# Patient Record
Sex: Male | Born: 1990 | Race: Black or African American | Hispanic: No | Marital: Single | Smoking: Never smoker
Health system: Southern US, Community
[De-identification: ages and names within clinical notes are randomized; demographics above are authoritative.]

## PROBLEM LIST (undated history)

## (undated) DIAGNOSIS — K759 Inflammatory liver disease, unspecified: Secondary | ICD-10-CM

## (undated) DIAGNOSIS — U071 COVID-19: Secondary | ICD-10-CM

## (undated) HISTORY — DX: Inflammatory liver disease, unspecified: K75.9

## (undated) HISTORY — PX: APPENDECTOMY: SHX54

---

## 2014-10-16 ENCOUNTER — Emergency Department (HOSPITAL_COMMUNITY)
Admission: EM | Admit: 2014-10-16 | Discharge: 2014-10-17 | Disposition: A | Discharge status: Home / Self Care | Payer: Self-pay | Attending: Emergency Medicine | Admitting: Emergency Medicine

## 2014-10-16 ENCOUNTER — Encounter (HOSPITAL_COMMUNITY): Payer: Self-pay | Admitting: Emergency Medicine

## 2014-10-16 DIAGNOSIS — R7401 Elevation of levels of liver transaminase levels: Secondary | ICD-10-CM

## 2014-10-16 DIAGNOSIS — R740 Nonspecific elevation of levels of transaminase and lactic acid dehydrogenase [LDH]: Secondary | ICD-10-CM

## 2014-10-16 DIAGNOSIS — Z9049 Acquired absence of other specified parts of digestive tract: Secondary | ICD-10-CM | POA: Insufficient documentation

## 2014-10-16 DIAGNOSIS — B179 Acute viral hepatitis, unspecified: Secondary | ICD-10-CM | POA: Insufficient documentation

## 2014-10-16 NOTE — ED Notes (Signed)
Pt reports just being released from hospital a week ago for stomach flu. Pt states my "sperm is yellow." Pt also reports itching and abdominal pain x1 week.

## 2014-10-17 ENCOUNTER — Emergency Department (HOSPITAL_COMMUNITY): Payer: Self-pay

## 2014-10-17 ENCOUNTER — Telehealth: Payer: Self-pay | Admitting: Gastroenterology

## 2014-10-17 LAB — URINALYSIS, ROUTINE W REFLEX MICROSCOPIC
GLUCOSE, UA: NEGATIVE mg/dL
Hgb urine dipstick: NEGATIVE
Ketones, ur: NEGATIVE mg/dL
Nitrite: NEGATIVE
Protein, ur: NEGATIVE mg/dL
SPECIFIC GRAVITY, URINE: 1.026 (ref 1.005–1.030)
Urobilinogen, UA: 0.2 mg/dL (ref 0.0–1.0)
pH: 5 (ref 5.0–8.0)

## 2014-10-17 LAB — CBC WITH DIFFERENTIAL/PLATELET
BASOS ABS: 0 10*3/uL (ref 0.0–0.1)
BASOS PCT: 1 % (ref 0–1)
Eosinophils Absolute: 0 10*3/uL (ref 0.0–0.7)
Eosinophils Relative: 1 % (ref 0–5)
HCT: 42.4 % (ref 39.0–52.0)
Hemoglobin: 13.9 g/dL (ref 13.0–17.0)
Lymphocytes Relative: 47 % — ABNORMAL HIGH (ref 12–46)
Lymphs Abs: 1.9 10*3/uL (ref 0.7–4.0)
MCH: 27.9 pg (ref 26.0–34.0)
MCHC: 32.8 g/dL (ref 30.0–36.0)
MCV: 85.1 fL (ref 78.0–100.0)
MONO ABS: 0.7 10*3/uL (ref 0.1–1.0)
Monocytes Relative: 17 % — ABNORMAL HIGH (ref 3–12)
NEUTROS ABS: 1.4 10*3/uL — AB (ref 1.7–7.7)
NEUTROS PCT: 34 % — AB (ref 43–77)
Platelets: 213 10*3/uL (ref 150–400)
RBC: 4.98 MIL/uL (ref 4.22–5.81)
RDW: 15.2 % (ref 11.5–15.5)
WBC: 4 10*3/uL (ref 4.0–10.5)

## 2014-10-17 LAB — COMPREHENSIVE METABOLIC PANEL
ALT: 1699 U/L — AB (ref 0–53)
ANION GAP: 4 — AB (ref 5–15)
AST: 1376 U/L — AB (ref 0–37)
Albumin: 3.6 g/dL (ref 3.5–5.2)
Alkaline Phosphatase: 122 U/L — ABNORMAL HIGH (ref 39–117)
BUN: 16 mg/dL (ref 6–23)
CO2: 25 mmol/L (ref 19–32)
Calcium: 8.8 mg/dL (ref 8.4–10.5)
Chloride: 106 mmol/L (ref 96–112)
Creatinine, Ser: 1.18 mg/dL (ref 0.50–1.35)
GFR calc Af Amer: >90 mL/min (ref >90)
GFR calc non Af Amer: 86 mL/min — ABNORMAL LOW (ref >90)
Glucose, Bld: 86 mg/dL (ref 70–99)
Potassium: 3.7 mmol/L (ref 3.5–5.1)
SODIUM: 135 mmol/L (ref 135–145)
TOTAL PROTEIN: 7.1 g/dL (ref 6.0–8.3)
Total Bilirubin: 8.1 mg/dL — ABNORMAL HIGH (ref 0.3–1.2)

## 2014-10-17 LAB — ACETAMINOPHEN LEVEL: Acetaminophen (Tylenol), Serum: <10 ug/mL — ABNORMAL LOW (ref 10–30)

## 2014-10-17 LAB — URINE MICROSCOPIC-ADD ON

## 2014-10-17 MED ORDER — ONDANSETRON HCL 4 MG PO TABS: 4.0000 mg | ORAL_TABLET | Freq: Four times a day (QID) | ORAL | Status: DC

## 2014-10-17 NOTE — Telephone Encounter (Signed)
Patient has been scheduled with Willette ClusterPaula Guenther for 10/19/14

## 2014-10-17 NOTE — ED Provider Notes (Signed)
CSN: [id number]     Arrival date & time 10/16/14  2327 History   First MD Initiated Contact with Patient 10/17/14 [phone]     Chief Complaint  Patient presents with  . mulitple complaints      (Consider location/radiation/quality/duration/timing/severity/associated sxs/prior Treatment) Patient is a 24 y.o. male presenting with abdominal pain. The history is provided by the patient. No language interpreter was used.  Abdominal Pain Associated symptoms: no chills, no fever, no nausea and no vomiting   Associated symptoms comment:  The patient presents with multiple complaints including periumbilical abdominal pain/cramping persistent over the last week, generalized pruritis, a yellow tint to his eyes and he noticed the same yellow tint to his sperm yesterday. He has not has any vomiting, fever, dysuria or dark urine. He reports recent hospitalization for a "stomach flu" discharged 2 weeks ago.   History reviewed. No pertinent past medical history. Past Surgical History  Procedure Laterality Date  . Appendectomy     History reviewed. No pertinent family history. History  Substance Use Topics  . Smoking status: Never Smoker   . Smokeless tobacco: Not on file  . Alcohol Use: Yes    Review of Systems  Constitutional: Negative for fever and chills.  HENT: Negative.   Eyes:       Complains of yellow tint to his eyes. No visual impairment.  Respiratory: Negative.   Cardiovascular: Negative.   Gastrointestinal: Positive for abdominal pain. Negative for nausea and vomiting.  Genitourinary: Negative.   Musculoskeletal: Negative.  Negative for myalgias.  Skin: Negative.  Negative for color change and pallor.  Neurological: Negative.       Allergies  Zyrtec allergy  Home Medications   Prior to Admission medications   Medication Sig Start Date End Date Taking? Authorizing Provider  HYDROcodone-acetaminophen (NORCO/VICODIN) 5-325 MG per tablet Take 1 tablet by mouth every 6 (six)  hours as needed for moderate pain (for pain.).   Yes Historical Provider, MD   BP 134/74 mmHg  Pulse 66  Temp(Src) 98.3 F (36.8 C) (Oral)  Resp 16  SpO2 100% Physical Exam  Constitutional: He is oriented to person, place, and time. He appears well-developed and well-nourished.  HENT:  Head: Normocephalic.  Eyes: Scleral icterus is present.  Neck: Normal range of motion. Neck supple.  Cardiovascular: Normal rate and regular rhythm.   Pulmonary/Chest: Effort normal and breath sounds normal.  Abdominal: Soft. Bowel sounds are normal. There is tenderness. There is no rebound and no guarding.  Very mild tenderness limited to periumbilical abdomen. No distention. No HSM or tenderness over the RUQ.   Musculoskeletal: Normal range of motion. He exhibits no edema.  Neurological: He is alert and oriented to person, place, and time.  Skin: Skin is warm and dry. No rash noted.  Psychiatric: He has a normal mood and affect.    ED Course  Procedures (including critical care time) Labs Review Labs Reviewed  COMPREHENSIVE METABOLIC PANEL  CBC WITH DIFFERENTIAL/PLATELET  URINALYSIS, ROUTINE W REFLEX MICROSCOPIC   Results for orders placed or performed during the hospital encounter of 10/16/14  Comprehensive metabolic panel  Result Value Ref Range   Sodium 135 135 - 145 mmol/L   Potassium 3.7 3.5 - 5.1 mmol/L   Chloride 106 96 - 112 mmol/L   CO2 25 19 - 32 mmol/L   Glucose, Bld 86 70 - 99 mg/dL   BUN 16 6 - 23 mg/dL   Creatinine, Ser 1.191.18 0.50 - 1.35 mg/dL   Calcium 8.8  8.4 - 10.5 mg/dL   Total Protein 7.1 6.0 - 8.3 g/dL   Albumin 3.6 3.5 - 5.2 g/dL   AST 1610 (H) 0 - 37 U/L   ALT 1699 (H) 0 - 53 U/L   Alkaline Phosphatase 122 (H) 39 - 117 U/L   Total Bilirubin 8.1 (H) 0.3 - 1.2 mg/dL   GFR calc non Af Amer 86 (L) >90 mL/min   GFR calc Af Amer >90 >90 mL/min   Anion gap 4 (L) 5 - 15  CBC with Differential  Result Value Ref Range   WBC 4.0 4.0 - 10.5 K/uL   RBC 4.98 4.22 - 5.81  MIL/uL   Hemoglobin 13.9 13.0 - 17.0 g/dL   HCT 96.0 45.4 - 09.8 %   MCV 85.1 78.0 - 100.0 fL   MCH 27.9 26.0 - 34.0 pg   MCHC 32.8 30.0 - 36.0 g/dL   RDW 11.9 14.7 - 82.9 %   Platelets 213 150 - 400 K/uL   Neutrophils Relative % 34 (L) 43 - 77 %   Neutro Abs 1.4 (L) 1.7 - 7.7 K/uL   Lymphocytes Relative 47 (H) 12 - 46 %   Lymphs Abs 1.9 0.7 - 4.0 K/uL   Monocytes Relative 17 (H) 3 - 12 %   Monocytes Absolute 0.7 0.1 - 1.0 K/uL   Eosinophils Relative 1 0 - 5 %   Eosinophils Absolute 0.0 0.0 - 0.7 K/uL   Basophils Relative 1 0 - 1 %   Basophils Absolute 0.0 0.0 - 0.1 K/uL  Urinalysis, Routine w reflex microscopic  Result Value Ref Range   Color, Urine AMBER (A) YELLOW   APPearance HAZY (A) CLEAR   Specific Gravity, Urine 1.026 1.005 - 1.030   pH 5.0 5.0 - 8.0   Glucose, UA NEGATIVE NEGATIVE mg/dL   Hgb urine dipstick NEGATIVE NEGATIVE   Bilirubin Urine MODERATE (A) NEGATIVE   Ketones, ur NEGATIVE NEGATIVE mg/dL   Protein, ur NEGATIVE NEGATIVE mg/dL   Urobilinogen, UA 0.2 0.0 - 1.0 mg/dL   Nitrite NEGATIVE NEGATIVE   Leukocytes, UA SMALL (A) NEGATIVE  Urine microscopic-add on  Result Value Ref Range   WBC, UA 0-2 <3 WBC/hpf   Crystals CA OXALATE CRYSTALS (A) NEGATIVE    Imaging Review No results found.   EKG Interpretation None      MDM   Final diagnoses:  None    1. Acute hepatitis  The patient is well appearing without vomiting or continued diarrhea. No fever. Discussed with Dr. Christella Hartigan of Gastroenterology. Requested additional lab tests but advised patient can be discharged home and managed in the outpatient setting. Return precautions provided to the patient. Zofran given and, as recommended by Dr. Christella Hartigan, encouraged regular use.     Arnoldo Hooker, PA-C 10/17/14 [id number]  Ward Givens, MD 10/17/14 [phone]

## 2014-10-17 NOTE — ED Provider Notes (Signed)
Patient reports he was admitted to the hospital a week ago he was having vomiting. He relates currently he is not having any nausea or vomiting. He denies any abdominal pain except in his lower abdomen. He states he has noticed his urine has been brown in color for the past 3-4 days and his stools are getting lighter in color. Yesterday some friends at school noticed his eyes looked yellow. Patient denies being on any medications including acetaminophen. He denies any IV drug use. He states he's never had this before.  Patient is alert and cooperative. He is noted to have scleral icterus. His abdomen is soft and nontender.  Medical screening examination/treatment/procedure(s) were conducted as a shared visit with non-physician practitioner(s) and myself.  I personally evaluated the patient during the encounter.   EKG Interpretation None        Ward GivensIva L Kiva Norland, MD 10/17/14 [phone]

## 2014-10-17 NOTE — Telephone Encounter (Signed)
Was called by ER last night about him.  Acute hepatitis without clinical signs of encephalopathy.  Agreed it was OK to send him home with expedited out patient follow up.  Viral studies, ANA drawn.  Patty, He needs new gi evaluation with first available MD or extender for acute hepatitis.  Thanks

## 2014-10-17 NOTE — Discharge Instructions (Signed)
IT IS IMPORTANT TO MAINTAIN A NORMAL DIET AND EAT REGULARLY EVEN IF YOU DON'T FEEL LIKE EATING. TAKE ZOFRAN EVERY 8 HOURS TO CONTROL NAUSEA. CALL DR. Christella HartiganJACOBS' OFFICE TO SCHEDULE AN APPOINTMENT FOR TODAY OR TOMORROW.  Jaundice Jaundice is a yellowish discoloration of the skin, whites of the eyes, and mucous membranes. It is caused by increased levels of bilirubin in the blood (hyperbilirubinemia). Bilirubin is produced by the normal breakdown of red blood cells. Jaundice may mean the liver or bile system is not working normally. CAUSES  The most common causes include:  Viral hepatitis.  Gallstones.  Excess use of alcohol.  Liver disease.  Certain cancers. SYMPTOMS   Yellow color to the skin, whites of the eyes, or mucous membranes.  Dark brown colored urine.  Stomach pain.  Light or clay colored stool.  Itchy skin. DIAGNOSIS   Your history will be taken along with a physical exam.  Urine and blood tests.  Abdominal ultrasound.  CT scans.  MRI.  Liver biopsy if the liver disease is suspected.  Endoscopic retrograde cholangiopancreatography (ERCP). TREATMENT  Treatment depends on the cause or related to the treatment of an underlying condition. For example, if jaundice is caused by gallstones, the stones or gallbladder may need to be removed. Other treatments may include:  Rest.  Stopping a certain medicine if it is causing the jaundice.  Giving fluid through the vein (IV fluids).  Surgery (removing gallstones, cancers). Some conditions that cause jaundice can be fatal if not treated. HOME CARE INSTRUCTIONS   Rest.  Drink enough fluids to keep your urine clear or pale yellow.  Avoid all alcoholic drinks.  Only take over-the-counter or prescription medicines for nausea, vomiting, itching, pain, discomfort, or fever as directed by your caregiver.  If jaundice is due to viral hepatitis or an infection:  Avoid close contact with people.  Avoid preparing  food for others.  Avoid sharing utensils with others.  Wash your hands often.  Keep all follow-up appointments with your caregiver.  Use skin lotions to relieve itching. SEEK IMMEDIATE MEDICAL CARE IF:   You have increased pain.  You have repeated vomiting.  You become dehydrated.  You have a fever or persistent symptoms for more than 72 hours.  You have a fever and your symptoms suddenly get worse.  You become weak or confused.  You develop a severe headache. MAKE SURE YOU:   Understand these instructions.  Will watch your condition.  Will get help right away if you are not doing well or get worse. Document Released: 08/10/2005 Document Revised: 11/02/2011 Document Reviewed: 07/25/2010 [facility] Patient Information 2015 [employer] [place]. This information is not intended to replace advice given to you by your health care provider. Make sure you discuss any questions you have with your health care provider.

## 2014-10-18 LAB — FANA STAINING PATTERNS

## 2014-10-18 LAB — ANTINUCLEAR ANTIBODIES, IFA

## 2014-10-18 LAB — HIV ANTIBODY (ROUTINE TESTING W REFLEX): HIV Screen 4th Generation wRfx: NONREACTIVE

## 2014-10-19 ENCOUNTER — Ambulatory Visit (INDEPENDENT_AMBULATORY_CARE_PROVIDER_SITE_OTHER): Payer: Self-pay | Admitting: Nurse Practitioner

## 2014-10-19 ENCOUNTER — Encounter: Payer: Self-pay | Admitting: Nurse Practitioner

## 2014-10-19 ENCOUNTER — Other Ambulatory Visit (INDEPENDENT_AMBULATORY_CARE_PROVIDER_SITE_OTHER): Payer: Self-pay

## 2014-10-19 VITALS — BP 112/80 | HR 60 | Ht 72.0 in | Wt 200.0 lb

## 2014-10-19 DIAGNOSIS — K759 Inflammatory liver disease, unspecified: Secondary | ICD-10-CM

## 2014-10-19 DIAGNOSIS — R945 Abnormal results of liver function studies: Secondary | ICD-10-CM

## 2014-10-19 DIAGNOSIS — R7989 Other specified abnormal findings of blood chemistry: Secondary | ICD-10-CM

## 2014-10-19 LAB — HEPATIC FUNCTION PANEL
ALT: 2233 U/L — ABNORMAL HIGH (ref 0–53)
AST: 2088 U/L — ABNORMAL HIGH (ref 0–37)
Albumin: 3.9 g/dL (ref 3.5–5.2)
Alkaline Phosphatase: 144 U/L — ABNORMAL HIGH (ref 39–117)
Bilirubin, Direct: 6.4 mg/dL — ABNORMAL HIGH (ref 0.0–0.3)
Total Bilirubin: 10 mg/dL — ABNORMAL HIGH (ref 0.2–1.2)
Total Protein: 7.5 g/dL (ref 6.0–8.3)

## 2014-10-19 LAB — PROTIME-INR
INR: 1.1 ratio — AB (ref 0.8–1.0)
Prothrombin Time: 12 s (ref 9.6–13.1)

## 2014-10-19 NOTE — Patient Instructions (Addendum)
Your physician has requested that you go to the basement for lab work before leaving today. I  You have been scheduled for an  Ultrasound with doppler of hepatic and portal vein on 10-26-14 at 9:30am. Please arrive 15 minutes prior to your appointment for registration. Make certain not to have anything to eat or drink after midnight. Should you need to reschedule your appointment, please contact [facility] [phone].  [address] [place]  Please keep follow up appt for 10-23-14 at 3:00pm

## 2014-10-20 ENCOUNTER — Telehealth: Payer: Self-pay | Admitting: Internal Medicine

## 2014-10-20 LAB — IGG: IGG (IMMUNOGLOBIN G), SERUM: 2390 mg/dL — AB (ref 650–1600)

## 2014-10-20 LAB — HEPATITIS C ANTIBODY: HCV AB: NEGATIVE

## 2014-10-20 LAB — HEPATITIS B SURF AG CONFIRMATION: Hepatitis B Surf Ag Confirmation: POSITIVE — AB

## 2014-10-20 LAB — HEPATITIS B SURFACE ANTIGEN: Hepatitis B Surface Ag: POSITIVE — AB

## 2014-10-20 LAB — HEPATITIS A ANTIBODY, TOTAL: Hep A Total Ab: NONREACTIVE

## 2014-10-20 MED ORDER — PREDNISONE 10 MG PO TABS: 40.0000 mg | ORAL_TABLET | Freq: Every day | ORAL | Status: DC

## 2014-10-20 NOTE — Telephone Encounter (Signed)
I have performed chart review after seeing pt yesterday with Willette ClusterPaula Guenther, NP Hepatitis with cholestasis and jaundice ANA is elevated as is IGG, suggestive of AIH I called [facility] labs and the ASMA is "only run on Tuesday and Friday".  I asked it to be run stat on Monday Hep B surface Ag should be resulted later today INR is stable at 1.1 Pt is at home with mother and father I spoke to all 3 this afternoon.  No bleeding or confusion.  Still be nausea, itching and fatigue I am going to start prednisone for AIH empirically while awaiting on other labs.  I advised they stay with him and watch for any confusion or change in mental status.  Should this occur they are instructed to go to ED immediately.  They voiced understanding Need to check TPMT Tuesday at office follow-up Prednisone 40 mg daily  If no response or worsening may need liver bx or hepatology referral to [facility], [facility] or [facility] in [place]

## 2014-10-21 ENCOUNTER — Telehealth: Payer: Self-pay | Admitting: Internal Medicine

## 2014-10-21 NOTE — Telephone Encounter (Signed)
New information Hep B Surf Ag is Positive which is consistent with Hep B most likely acute.  We need Core IgM and viral load (viral load to be drawn, Core IgM is pending from acute hep panel from 10/17/2014) Unsure why ANA and IGG are so positive, could still be AIH, but the 2 together would be rare I spoke to him by phone, if anything he feels a bit better today Will have him stop prednisone for now, pending more information OV Tuesday as scheduled Followup pending labs If LFTs worsen still or INR elevates then will start Hep B treatment with antivirals (though virus should clear spontaneous leading to immunity) If picture remains unclear then will pursue liver biopsy

## 2014-10-22 ENCOUNTER — Telehealth: Payer: Self-pay | Admitting: Nurse Practitioner

## 2014-10-22 ENCOUNTER — Encounter: Payer: Self-pay | Admitting: Nurse Practitioner

## 2014-10-22 ENCOUNTER — Telehealth: Payer: Self-pay | Admitting: *Deleted

## 2014-10-22 DIAGNOSIS — R945 Abnormal results of liver function studies: Secondary | ICD-10-CM | POA: Insufficient documentation

## 2014-10-22 DIAGNOSIS — R7989 Other specified abnormal findings of blood chemistry: Secondary | ICD-10-CM | POA: Insufficient documentation

## 2014-10-22 LAB — ANA: ANA: NEGATIVE

## 2014-10-22 LAB — ANTI-SMOOTH MUSCLE ANTIBODY, IGG: Smooth Muscle Ab: 27 U — ABNORMAL HIGH (ref <20)

## 2014-10-22 LAB — ALPHA-1-ANTITRYPSIN: A-1 Antitrypsin, Ser: 227 mg/dL — ABNORMAL HIGH (ref 83–199)

## 2014-10-22 LAB — CERULOPLASMIN: Ceruloplasmin: 37 mg/dL — ABNORMAL HIGH (ref 18–36)

## 2014-10-22 NOTE — Telephone Encounter (Signed)
Per Willette ClusterPaula Guenther NP-C, called  Imaging at [phone] and cancelled the US Abd Pelvis Vein doppler.

## 2014-10-22 NOTE — Progress Notes (Addendum)
HPI :  Patient is a 24 year old male referred by [facility] emergency department for evaluation of abnormal liver function studies. Patient was [place] [facility] [place] [facility] for fever, nausea, vomiting, and abdominal pain. Patient already had a stomach virus but also some liver problems. He stayed hospitalized for 1 day then was discharged home. He recently discharge patient developed bloody diarrhea which lasted 3-4 days. Symptoms resolved he felt okay until a couple of days ago when abdominal pain, nausea and vomiting recurred. Patient's mother who is a nurse noticed that he was quite jaundiced. Urine was dark. Mother advised him to go to the emergency department. In the ED on 2/24 his AST was nearly 1400, ALT 1700. Total bili 8.1, alkaline phosphatase 122. CBC was unremarkable. Right upper quadrant ultrasound was normal. EGD spoke with our physician on call, Dr. Christella HartiganJacobs, who recommended an acute hepatitis panel and outpatient follow-up.   Patient is here today for EGD follow-up. He actually feels fine. No nausea, vomiting, diarrhea or abdominal pain. Unfortunately I have no labs from his [place] hospitalization. Patient has no prior history of any gastrointestinal problems. He has no known history of liver disease. No family history of liver disease. No history of blood transfusions, IV drug use. He is homosexual, practices protected sex.  Patient is in culinary stool.    Past Medical History  Diagnosis Date  . Hepatitis     Family History  Problem Relation Age of Onset  . Diabetes Maternal Grandfather   . Liver cancer Paternal Grandfather   . Diabetes Paternal Grandfather    History  Substance Use Topics  . Smoking status: Never Smoker   . Smokeless tobacco: Never Used  . Alcohol Use: 0.0 oz/week    0 Standard drinks or equivalent per week   Current Outpatient Prescriptions  Medication Sig Dispense Refill  . HYDROcodone-acetaminophen (NORCO/VICODIN) 5-325 MG  per tablet Take 1 tablet by mouth every 6 (six) hours as needed for moderate pain (for pain.).    Marland Kitchen. ondansetron (ZOFRAN) 4 MG tablet Take 1 tablet (4 mg total) by mouth every 6 (six) hours. 12 tablet 0  . predniSONE (DELTASONE) 10 MG tablet Take 4 tablets (40 mg total) by mouth daily with breakfast. 100 tablet 0   No current facility-administered medications for this visit.   Allergies  Allergen Reactions  . Zyrtec Allergy [Cetirizine Hcl] Swelling     Review of Systems: All systems reviewed and negative except where noted in HPI.    [place] Abdomen Limited Ruq  10/17/2014   CLINICAL DATA:  Transaminitis, hyperbilirubinemia.  EXAM: US ABDOMEN LIMITED - RIGHT UPPER QUADRANT  COMPARISON:  None.  FINDINGS: Gallbladder:  No gallstones or wall thickening visualized. No sonographic Murphy sign noted.  Common bile duct:  Diameter: 3 mm  Liver:  No focal lesion identified. Within normal limits in parenchymal echogenicity. Hepatopetal portal vein.  IMPRESSION: Normal RIGHT upper quadrant ultrasound.   Electronically Signed   By: Awilda Metroourtnay  Bloomer   On: 10/17/2014 05:10    Physical Exam: BP 112/80 mmHg  Pulse 60  Ht 6' (1.829 m)  Wt 200 lb (90.719 kg)  BMI 27.12 kg/m2 Constitutional: Pleasant,well-developed, black male in no acute distress. HEENT: Normocephalic and atraumatic. Conjunctivae are normal. No scleral icterus. Neck supple.  Cardiovascular: Normal rate, regular rhythm.  Pulmonary/chest: Effort normal and breath sounds normal. No wheezing, rales or rhonchi. Abdominal: Soft, nondistended, nontender. Bowel sounds active throughout. There are no masses palpable. No hepatomegaly.  Extremities: no edema Lymphadenopathy: No cervical adenopathy noted. Neurological: Alert and oriented to person place and time. Skin: Skin is warm and dry. No rashes noted. Psychiatric: Normal mood and affect. Behavior is normal.   ASSESSMENT AND PLAN:   24 year old healthy black male with hepatitis of unclear  etiology. He does have risk factors for viral hepatitis but need to evaluate for autoimmune / genetic liver diseases. Patient looks / feels okay. Unfortunately, his acute hepatitis panel drawn in the emergency department is not yet available. Will wait for those, check autoimmune/genetic markers, and INR. Patient will need very close follow-up, I will see him again with labs on Tuesday (today is Friday). He is at risk for acute liver failure. Patient was advised to stay home with family over the weekend in case he became acutely ill / developed encephalopathic changes. Mother is a Engineer, civil (consulting). Given this patient's age and remarkably abnormal LFTs I discussed this case with Dr. Rhea Belton who will be patient's gastroenterologist. Dr. Rhea Belton will be check on labs over the weekend  Addendum: Reviewed and agree with initial management. See telephone notes for detail (from 10/20/2014 and 10/21/2014) Discussed with Dr. Arlyce Dice, Wells Guiles at morning meeting Additional studies still pending.  This is likely acute Hep B, but I cannot exclude AIH and this may require Bx.  Will refer to [facility] for additional opinion.  Beverley Fiedler, MD

## 2014-10-23 ENCOUNTER — Telehealth: Payer: Self-pay | Admitting: Nurse Practitioner

## 2014-10-23 ENCOUNTER — Ambulatory Visit: Payer: Self-pay | Admitting: Nurse Practitioner

## 2014-10-23 NOTE — Telephone Encounter (Signed)
Patient's father notified that he does need to keep the appt for today if [facility].  Patient was instructed last night by Willette ClusterPaula Guenther RNP to [facility] [place] this am to the ER for possible admission.  [place] is still [place] [facility] [place] [facility] [place] this afternoon.  They are waiting on a child to get out of school to [facility].  Father did not understand why he would have an appointment today here and supposed to be in [place] also.  I explained that the appt today with Willette ClusterPaula Guenther RNP was a follow up that was scheduled at the last office visit.  I explained that the only reason to keep the appt today here with Willette ClusterPaula Guenther RNP , if [facility] were not [facility] and had more questions that need answering.  Earlier the father called and wanted him to be referred to a [place]atologist [place] [facility] [place].  While I was documenting this note the patient's mother called.  I spoke with her and explained the seriousness of his condition.  I explained that we tried to [facility] here and they did not feel it was appropriate to be seen [place].  He may require admission and additional testing and needs to [facility]. Gunnar Fusiaula has  Mother verbalized understanding of the importance of him getting [facility] as instructed last night by Willette ClusterPaula Guenther RNP.  See phone note from 10/22/14

## 2014-10-23 NOTE — Telephone Encounter (Signed)
Sean West,  See message from the patient.  They want to speak with you again.

## 2014-10-23 NOTE — Telephone Encounter (Signed)
See additional phone note from 10-23-2014.

## 2014-10-23 NOTE — Telephone Encounter (Signed)
Yesterday I spoke with Sean Majorawn Drazek, NP with [facility]. Discussed patient's case / labs. Dawn contacted Hepatologist Dr. Hamilton CapriZamor in [place] who recommended [facility] for evaluation and possible admission. I spoke with patient and his father. They are making arrangements for child care but will [facility].

## 2014-10-24 LAB — HEPATITIS PANEL, ACUTE
HCV AB: NEGATIVE
Hep A IgM: NONREACTIVE
Hep B C IgM: REACTIVE — AB
Hepatitis B Surface Ag: POSITIVE — AB

## 2014-10-24 LAB — HEPATITIS B SURF AG CONFIRMATION: Hepatitis B Surf Ag Confirmation: POSITIVE — AB

## 2014-10-26 ENCOUNTER — Other Ambulatory Visit: Payer: Self-pay

## 2014-10-30 ENCOUNTER — Ambulatory Visit: Payer: Self-pay | Admitting: Nurse Practitioner

## 2014-10-31 ENCOUNTER — Ambulatory Visit (INDEPENDENT_AMBULATORY_CARE_PROVIDER_SITE_OTHER): Payer: Self-pay

## 2014-10-31 DIAGNOSIS — Z23 Encounter for immunization: Secondary | ICD-10-CM

## 2014-10-31 DIAGNOSIS — Z113 Encounter for screening for infections with a predominantly sexual mode of transmission: Secondary | ICD-10-CM

## 2014-10-31 DIAGNOSIS — B20 Human immunodeficiency virus [HIV] disease: Secondary | ICD-10-CM

## 2014-11-01 LAB — URINALYSIS
BILIRUBIN URINE: NEGATIVE
GLUCOSE, UA: NEGATIVE mg/dL
HGB URINE DIPSTICK: NEGATIVE
Ketones, ur: NEGATIVE mg/dL
NITRITE: NEGATIVE
PH: 5 (ref 5.0–8.0)
Protein, ur: 30 mg/dL — AB
Specific Gravity, Urine: 1.021 (ref 1.005–1.030)
Urobilinogen, UA: 1 mg/dL (ref 0.0–1.0)

## 2014-11-01 LAB — RPR

## 2014-11-01 LAB — T-HELPER CELL (CD4) - (RCID CLINIC ONLY)
CD4 T CELL HELPER: 18 % — AB (ref 33–55)
CD4 T Cell Abs: 180 /uL — ABNORMAL LOW (ref 400–2700)

## 2014-11-01 LAB — URINE CYTOLOGY ANCILLARY ONLY
CHLAMYDIA, DNA PROBE: NEGATIVE
NEISSERIA GONORRHEA: NEGATIVE

## 2014-11-02 LAB — QUANTIFERON TB GOLD ASSAY (BLOOD)
Interferon Gamma Release Assay: NEGATIVE
MITOGEN VALUE: 1.94 [IU]/mL
QUANTIFERON NIL VALUE: 0.09 [IU]/mL
Quantiferon Tb Ag Minus Nil Value: 0 IU/mL
TB Ag value: 0.09 IU/mL

## 2014-11-02 LAB — HIV-1 RNA ULTRAQUANT REFLEX TO GENTYP+: HIV-1 RNA Quant, Log: >7 {Log} — ABNORMAL HIGH (ref <1.30)

## 2014-11-05 LAB — HLA B*5701: HLA-B*5701 w/rflx HLA-B High: NEGATIVE

## 2014-11-08 LAB — HIV-1 GENOTYPR PLUS

## 2014-11-08 NOTE — Progress Notes (Signed)
Patient here today for new hiv intake, he was diagnosed last month. He is in  very good spirits today, and has a lot of family support. He is accompanied by his Father today. Patient was [facility] for acute hepatitis B infection and worked received and extensive workup where he was initially diagnosed and referred to us. Patient was educated on the importance of condom use, vaccines given and records received. Patient is scheduled to return in 2 weeks.

## 2014-11-09 ENCOUNTER — Encounter: Payer: Self-pay | Admitting: *Deleted

## 2014-11-09 ENCOUNTER — Encounter: Payer: Self-pay | Admitting: Internal Medicine

## 2014-11-09 ENCOUNTER — Telehealth: Payer: Self-pay | Admitting: *Deleted

## 2014-11-09 ENCOUNTER — Ambulatory Visit (INDEPENDENT_AMBULATORY_CARE_PROVIDER_SITE_OTHER): Payer: Self-pay | Admitting: Internal Medicine

## 2014-11-09 VITALS — BP 132/79 | HR 79 | Temp 98.1°F | Ht 72.0 in | Wt 193.0 lb

## 2014-11-09 DIAGNOSIS — B161 Acute hepatitis B with delta-agent without hepatic coma: Secondary | ICD-10-CM

## 2014-11-09 DIAGNOSIS — R740 Nonspecific elevation of levels of transaminase and lactic acid dehydrogenase [LDH]: Secondary | ICD-10-CM

## 2014-11-09 DIAGNOSIS — B20 Human immunodeficiency virus [HIV] disease: Secondary | ICD-10-CM

## 2014-11-09 DIAGNOSIS — L989 Disorder of the skin and subcutaneous tissue, unspecified: Secondary | ICD-10-CM

## 2014-11-09 DIAGNOSIS — B169 Acute hepatitis B without delta-agent and without hepatic coma: Secondary | ICD-10-CM

## 2014-11-09 DIAGNOSIS — R7401 Elevation of levels of liver transaminase levels: Secondary | ICD-10-CM

## 2014-11-09 DIAGNOSIS — Z8619 Personal history of other infectious and parasitic diseases: Secondary | ICD-10-CM

## 2014-11-09 DIAGNOSIS — R110 Nausea: Secondary | ICD-10-CM

## 2014-11-09 LAB — COMPLETE METABOLIC PANEL WITH GFR
ALT: 334 U/L — AB (ref 0–53)
AST: 148 U/L — AB (ref 0–37)
Albumin: 3.5 g/dL (ref 3.5–5.2)
Alkaline Phosphatase: 108 U/L (ref 39–117)
BUN: 11 mg/dL (ref 6–23)
CALCIUM: 9.2 mg/dL (ref 8.4–10.5)
CHLORIDE: 103 meq/L (ref 96–112)
CO2: 29 mEq/L (ref 19–32)
CREATININE: 1.02 mg/dL (ref 0.50–1.35)
GFR, Est African American: >89 mL/min
GFR, Est Non African American: >89 mL/min
Glucose, Bld: 85 mg/dL (ref 70–99)
POTASSIUM: 4.3 meq/L (ref 3.5–5.3)
SODIUM: 139 meq/L (ref 135–145)
Total Bilirubin: 1.6 mg/dL — ABNORMAL HIGH (ref 0.2–1.2)
Total Protein: 7.2 g/dL (ref 6.0–8.3)

## 2014-11-09 MED ORDER — ELVITEG-COBIC-EMTRICIT-TENOFDF 150-150-200-300 MG PO TABS: 1.0000 | ORAL_TABLET | Freq: Every day | ORAL | Status: DC

## 2014-11-09 MED ORDER — PROMETHAZINE HCL 25 MG PO TABS: 25.0000 mg | ORAL_TABLET | Freq: Four times a day (QID) | ORAL | Status: DC | PRN

## 2014-11-09 NOTE — Progress Notes (Addendum)
Patient ID: Sean West, male   DOB: [birth year], 24 y.o.   MRN: [id number]       Patient ID: Sean West, male   DOB: 01-12-1991, 24 y.o.   MRN: [id number]  HPI Sean West is a 24yo M with acute HIV disease,( CD 4 count of 180/VL 35M +, genotype [id number], R NVP ). His RF for HIV is MSM. Has hx of having symptomatic gonorrhea last year. He has had several ED visits/hospitalizations since end of January- February 2016.  On jan 23,2016, he had short hospitalization for gastroenteritis, presenting with n/v/abd pain. Labs reveal cr 1.66, ast 63,and alt 103, tbili 1.8, lipase 10, ruq u/s normal. But did not do any hepatitis or hiv serology at that time.  On 2/24, he was seen in at [facility] ED, for jaundice, light colored stool. At this time he has extremely elevated transaminitis. AST of 1376 and ALT 1699. Tbili 10, conjugated bili 6.4, INR 1.1. Acute hep panel showed hep B surface antigen POSITIVE, hep B core IgM POSITIVE. RUQ nl. HIV ab negative. He did not have any mental status changes. Asked to no longer take any tylenol products, no alcohol and follow up in the GI clinic on 2/26  On 2/26-2/29, he seen at [facility] GI who ran labs to Auto immune hepatitis, wilson's disease, but felt that this was consistent with acute hepatitis B but also possible AI hepatitis. LFT still trending up at AST 2088 and ALT 2233.   He was referred to follow up with [facility] GI on 3/02 for further management. Hep B viral laod was 631,632 and HIV RNA viral laod was 6,213,086. Repeat RUQ normal but had slightly enlarged liver. AST 498 and ALT 954, which was trending down already over 2 days. AT this time, he was only given supportive care. Repeat testing showed c/w acute hep B. They repeated HIV test, which was now positive. He did subscribe to flu like illness during his visit at [facility] and not originally back on 2/24 where he was suffering from hepatitis.  He reports that he has Had only 1 sexual partner, in Feb. Had 2  other partners in January, and in December had 1 partner but the same person as February. Of the 3 people he has been with since December, he has reached 2 of them, who have been tested, and reportedly negative  No IVDU  [employer] [facility]   Allergies  Allergen Reactions  . Zyrtec Allergy [Cetirizine Hcl] Swelling    Outpatient Encounter Prescriptions as of 11/09/2014  Medication Sig  . [DISCONTINUED] HYDROcodone-acetaminophen (NORCO/VICODIN) 5-325 MG per tablet Take 1 tablet by mouth every 6 (six) hours as needed for moderate pain (for pain.).  . [DISCONTINUED] ondansetron (ZOFRAN) 4 MG tablet Take 1 tablet (4 mg total) by mouth every 6 (six) hours. (Patient not taking: Reported on 11/08/2014)  . [DISCONTINUED] predniSONE (DELTASONE) 10 MG tablet Take 4 tablets (40 mg total) by mouth daily with breakfast. (Patient not taking: Reported on 11/08/2014)     Patient Active Problem List   Diagnosis Date Noted  . Abnormal liver function tests 10/22/2014     Health Maintenance Due  Topic Date Due  . TETANUS/TDAP  09/09/2011     Review of Systems Ac rash - eczema. 12 point ros is otherwise negative Physical Exam   BP 132/79 mmHg  Pulse 79  Temp(Src) 98.1 F (36.7 C) (Oral)  Ht 6' (1.829 m)  Wt 193 lb (87.544 kg)  BMI 26.17 kg/m2 Physical  Exam  Constitutional: He is oriented to person, place, and time. He appears well-developed and well-nourished. No distress.  HENT:  Mouth/Throat: Oropharynx is clear and moist. No oropharyngeal exudate.  Cardiovascular: Normal rate, regular rhythm and normal heart sounds. Exam reveals no gallop and no friction rub.  No murmur heard.  Pulmonary/Chest: Effort normal and breath sounds normal. No respiratory distress. He has no wheezes.  Abdominal: Soft. Bowel sounds are normal. He exhibits no distension. There is no tenderness.  Lymphadenopathy:  He has no cervical adenopathy.  Neurological: He is alert and oriented to person, place,  and time.  Skin: Skin is warm and dry. Mild raised papular plaque rash erythematous appearance both AC Psychiatric: He has a normal mood and affect. His behavior is normal.     Lab Results  Component Value Date   CD4TCELL 18* 10/31/2014   Lab Results  Component Value Date   CD4TABS 180* 10/31/2014   Lab Results  Component Value Date   HIV1RNAQUANT >10000000* 10/31/2014   No results found for: HEPBSAB No results found for: RPR  CBC Lab Results  Component Value Date   WBC 4.1 11/09/2014   HGB 13.7 11/09/2014   HCT 40.1 11/09/2014   MCV 82.0 11/09/2014   PLT 331 11/09/2014   CMP     Component Value Date/Time   NA 139 11/09/2014 1154   K 4.3 11/09/2014 1154   CL 103 11/09/2014 1154   CO2 29 11/09/2014 1154   GLUCOSE 85 11/09/2014 1154   BUN 11 11/09/2014 1154   CREATININE 1.02 11/09/2014 1154   CREATININE 1.18 10/17/2014 0207   CALCIUM 9.2 11/09/2014 1154   PROT 7.2 11/09/2014 1154   ALBUMIN 3.5 11/09/2014 1154   AST 148* 11/09/2014 1154   ALT 334* 11/09/2014 1154   ALKPHOS 108 11/09/2014 1154   BILITOT 1.6* 11/09/2014 1154   GFRNONAA >89 11/09/2014 1154   GFRNONAA 86* 10/17/2014 0207   GFRAA >89 11/09/2014 1154   GFRAA >90 10/17/2014 0207     Assessment and Plan  Acute hiv disease = we will start him on stribild. Gave instructions to how to take meds. If he is feeling nausea, can take phenergan to help with symptoms. We will apply for emergency ADAP  Acute hepatitis b = transaminitis appears to be improving. Tbili now only 1.6 and transaminitis are improved as well now down to 148 and 334. We will plan on getting CMP again in 10-14 days to insure he is improving. We will check for hep b viral load.   We will start hep A vaccination at next visit  hiv prevention = recommend to abstain from sex over the next 4-6 wk and use condoms for all sexual acts. He has exceedingly high viral load in acute setting  Vaccinations = will start his vaccines at next visit,  hep A, flu, pneumonia  oi proph = cd 4 count is temporarily low, anticipate to be > 200 in 1 month. No need for bactrim  Eczema = continue with hydrocortisone cream prn  Get outside records from [facility] and [facility] to confirm data  rtc for labs in 4 wk and appt in 6 wk

## 2014-11-09 NOTE — Telephone Encounter (Signed)
[facility] Per Dr. Drue SecondSnider, this application should be marked Urgent and rushed due to the patient's health. Andree CossHowell, Michelle M, RN

## 2014-11-10 LAB — CBC WITH DIFFERENTIAL/PLATELET
BASOS ABS: 0.1 10*3/uL (ref 0.0–0.1)
BASOS PCT: 2 % — AB (ref 0–1)
EOS PCT: 0 % (ref 0–5)
Eosinophils Absolute: 0 10*3/uL (ref 0.0–0.7)
HCT: 40.1 % (ref 39.0–52.0)
Hemoglobin: 13.7 g/dL (ref 13.0–17.0)
Lymphocytes Relative: 53 % — ABNORMAL HIGH (ref 12–46)
Lymphs Abs: 2.2 10*3/uL (ref 0.7–4.0)
MCH: 28 pg (ref 26.0–34.0)
MCHC: 34.2 g/dL (ref 30.0–36.0)
MCV: 82 fL (ref 78.0–100.0)
MONO ABS: 0.9 10*3/uL (ref 0.1–1.0)
MPV: 10.4 fL (ref 8.6–12.4)
Monocytes Relative: 21 % — ABNORMAL HIGH (ref 3–12)
Neutro Abs: 1 10*3/uL — ABNORMAL LOW (ref 1.7–7.7)
Neutrophils Relative %: 24 % — ABNORMAL LOW (ref 43–77)
Platelets: 331 10*3/uL (ref 150–400)
RBC: 4.89 MIL/uL (ref 4.22–5.81)
RDW: 14.7 % (ref 11.5–15.5)
WBC: 4.1 10*3/uL (ref 4.0–10.5)

## 2014-11-12 ENCOUNTER — Encounter: Payer: Self-pay | Admitting: Internal Medicine

## 2014-11-12 DIAGNOSIS — Z8619 Personal history of other infectious and parasitic diseases: Secondary | ICD-10-CM | POA: Insufficient documentation

## 2014-11-12 DIAGNOSIS — B169 Acute hepatitis B without delta-agent and without hepatic coma: Secondary | ICD-10-CM | POA: Insufficient documentation

## 2014-11-12 DIAGNOSIS — B20 Human immunodeficiency virus [HIV] disease: Secondary | ICD-10-CM | POA: Insufficient documentation

## 2014-11-12 LAB — PATHOLOGIST SMEAR REVIEW

## 2014-11-12 MED ORDER — ELVITEG-COBIC-EMTRICIT-TENOFDF 150-150-200-300 MG PO TABS: 1.0000 | ORAL_TABLET | Freq: Every day | ORAL | Status: DC

## 2014-11-14 LAB — HEPATITIS B DNA, ULTRAQUANTITATIVE, PCR
Hepatitis B DNA (Calc): 2064290 copies/mL — ABNORMAL HIGH (ref <116)
Hepatitis B DNA: 354689 IU/mL — ABNORMAL HIGH (ref <20)

## 2014-11-15 ENCOUNTER — Ambulatory Visit: Payer: Self-pay | Admitting: Infectious Disease

## 2014-11-21 NOTE — Telephone Encounter (Signed)
This is a duplicate message.  See other phone notes from 10/23/14

## 2014-12-11 ENCOUNTER — Encounter: Payer: Self-pay | Admitting: Internal Medicine

## 2014-12-11 ENCOUNTER — Other Ambulatory Visit: Payer: Self-pay

## 2014-12-17 ENCOUNTER — Other Ambulatory Visit: Payer: Self-pay | Admitting: *Deleted

## 2014-12-17 DIAGNOSIS — B20 Human immunodeficiency virus [HIV] disease: Secondary | ICD-10-CM

## 2014-12-17 MED ORDER — ELVITEG-COBIC-EMTRICIT-TENOFDF 150-150-200-300 MG PO TABS: 1.0000 | ORAL_TABLET | Freq: Every day | ORAL | Status: DC

## 2014-12-18 ENCOUNTER — Other Ambulatory Visit (INDEPENDENT_AMBULATORY_CARE_PROVIDER_SITE_OTHER): Payer: Self-pay

## 2014-12-18 DIAGNOSIS — B20 Human immunodeficiency virus [HIV] disease: Secondary | ICD-10-CM

## 2014-12-18 LAB — CBC WITH DIFFERENTIAL/PLATELET
BASOS ABS: 0 10*3/uL (ref 0.0–0.1)
Basophils Relative: 1 % (ref 0–1)
EOS ABS: 0.1 10*3/uL (ref 0.0–0.7)
EOS PCT: 3 % (ref 0–5)
HCT: 43 % (ref 39.0–52.0)
Hemoglobin: 15 g/dL (ref 13.0–17.0)
LYMPHS ABS: 2.2 10*3/uL (ref 0.7–4.0)
Lymphocytes Relative: 52 % — ABNORMAL HIGH (ref 12–46)
MCH: 28.8 pg (ref 26.0–34.0)
MCHC: 34.9 g/dL (ref 30.0–36.0)
MCV: 82.7 fL (ref 78.0–100.0)
MONOS PCT: 10 % (ref 3–12)
MPV: 10.3 fL (ref 8.6–12.4)
Monocytes Absolute: 0.4 10*3/uL (ref 0.1–1.0)
NEUTROS ABS: 1.5 10*3/uL — AB (ref 1.7–7.7)
Neutrophils Relative %: 34 % — ABNORMAL LOW (ref 43–77)
Platelets: 221 10*3/uL (ref 150–400)
RBC: 5.2 MIL/uL (ref 4.22–5.81)
RDW: 14.8 % (ref 11.5–15.5)
WBC: 4.3 10*3/uL (ref 4.0–10.5)

## 2014-12-18 LAB — COMPREHENSIVE METABOLIC PANEL
ALT: 25 U/L (ref 0–53)
AST: 26 U/L (ref 0–37)
Albumin: 4.1 g/dL (ref 3.5–5.2)
Alkaline Phosphatase: 58 U/L (ref 39–117)
BILIRUBIN TOTAL: 1 mg/dL (ref 0.2–1.2)
BUN: 12 mg/dL (ref 6–23)
CO2: 24 mEq/L (ref 19–32)
CREATININE: 1.12 mg/dL (ref 0.50–1.35)
Calcium: 9.6 mg/dL (ref 8.4–10.5)
Chloride: 105 mEq/L (ref 96–112)
Glucose, Bld: 80 mg/dL (ref 70–99)
POTASSIUM: 4.4 meq/L (ref 3.5–5.3)
SODIUM: 140 meq/L (ref 135–145)
Total Protein: 7.3 g/dL (ref 6.0–8.3)

## 2014-12-20 LAB — T-HELPER CELL (CD4) - (RCID CLINIC ONLY)
CD4 % Helper T Cell: 26 % — ABNORMAL LOW (ref 33–55)
CD4 T Cell Abs: 530 /uL (ref 400–2700)

## 2014-12-20 LAB — HIV-1 RNA QUANT-NO REFLEX-BLD
HIV 1 RNA Quant: 73 copies/mL — ABNORMAL HIGH (ref <20)
HIV-1 RNA QUANT, LOG: 1.86 {Log} — AB (ref <1.30)

## 2014-12-25 ENCOUNTER — Ambulatory Visit: Payer: Self-pay | Admitting: Internal Medicine

## 2015-01-07 ENCOUNTER — Ambulatory Visit (INDEPENDENT_AMBULATORY_CARE_PROVIDER_SITE_OTHER): Payer: Self-pay | Admitting: Internal Medicine

## 2015-01-07 ENCOUNTER — Encounter: Payer: Self-pay | Admitting: Internal Medicine

## 2015-01-07 VITALS — Temp 98.8°F | Wt 210.0 lb

## 2015-01-07 DIAGNOSIS — Z23 Encounter for immunization: Secondary | ICD-10-CM

## 2015-01-07 DIAGNOSIS — B169 Acute hepatitis B without delta-agent and without hepatic coma: Secondary | ICD-10-CM

## 2015-01-07 LAB — COMPLETE METABOLIC PANEL WITH GFR
ALT: 31 U/L (ref 0–53)
AST: 25 U/L (ref 0–37)
Albumin: 4.2 g/dL (ref 3.5–5.2)
Alkaline Phosphatase: 54 U/L (ref 39–117)
BILIRUBIN TOTAL: 0.9 mg/dL (ref 0.2–1.2)
BUN: 12 mg/dL (ref 6–23)
CHLORIDE: 103 meq/L (ref 96–112)
CO2: 28 mEq/L (ref 19–32)
CREATININE: 1.39 mg/dL — AB (ref 0.50–1.35)
Calcium: 9.8 mg/dL (ref 8.4–10.5)
GFR, EST NON AFRICAN AMERICAN: 71 mL/min
GFR, Est African American: 83 mL/min
GLUCOSE: 91 mg/dL (ref 70–99)
Potassium: 4.6 mEq/L (ref 3.5–5.3)
Sodium: 140 mEq/L (ref 135–145)
Total Protein: 7.5 g/dL (ref 6.0–8.3)

## 2015-01-07 NOTE — Progress Notes (Signed)
Patient ID: Sean West, male   DOB: March 29, 1991, 24 y.o.   MRN: [id number]       Patient ID: Sean West, male   DOB: March 29, 1991, 24 y.o.   MRN: [id number]  HPI 24 yo M with acute hiv-hep B who recently diagnosed with both infections. He was initially seen in Mid March to start stribild. After 1 month of treatment, CD 4 count of 530/VL 73. He continues to do well with meds. Not missing any doses, although he has lost his bottle that contained last 5 pills. He  States the has not difficulty with his medications feeling great.  Not currently sexually active. Back to work.  Outpatient Encounter Prescriptions as of 01/07/2015  Medication Sig  . elvitegravir-cobicistat-emtricitabine-tenofovir (STRIBILD) 150-150-200-300 MG TABS tablet Take 1 tablet by mouth daily with breakfast.  . promethazine (PHENERGAN) 25 MG tablet Take 1 tablet (25 mg total) by mouth every 6 (six) hours as needed for nausea or vomiting.   No facility-administered encounter medications on file as of 01/07/2015.     Patient Active Problem List   Diagnosis Date Noted  . HIV disease 11/12/2014  . Acute hepatitis B 11/12/2014  . History of gonorrhea 11/12/2014  . Abnormal liver function tests 10/22/2014     Health Maintenance Due  Topic Date Due  . TETANUS/TDAP  09/09/2011     Review of Systems  Constitutional: Negative for fever, chills, diaphoresis, activity change, appetite change, fatigue and unexpected weight change.  HENT: Negative for congestion, sore throat, rhinorrhea, sneezing, trouble swallowing and sinus pressure.  Eyes: Negative for photophobia and visual disturbance.  Respiratory: Negative for cough, chest tightness, shortness of breath, wheezing and stridor.  Cardiovascular: Negative for chest pain, palpitations and leg swelling.  Gastrointestinal: Negative for nausea, vomiting, abdominal pain, diarrhea, constipation, blood in stool, abdominal distention and anal bleeding.    Genitourinary: Negative for dysuria, hematuria, flank pain and difficulty urinating.  Musculoskeletal: Negative for myalgias, back pain, joint swelling, arthralgias and gait problem.  Skin: Negative for color change, pallor, rash and wound.  Neurological: Negative for dizziness, tremors, weakness and light-headedness.  Hematological: Negative for adenopathy. Does not bruise/bleed easily.  Psychiatric/Behavioral: Negative for behavioral problems, confusion, sleep disturbance, dysphoric mood, decreased concentration and agitation.    Physical Exam   Temp(Src) 98.8 F (37.1 C) (Oral)  Wt 210 lb (95.255 kg) Physical Exam  Constitutional: He is oriented to person, place, and time. He appears well-developed and well-nourished. No distress.  HENT:  Mouth/Throat: Oropharynx is clear and moist. No oropharyngeal exudate.  Cardiovascular: Normal rate, regular rhythm and normal heart sounds. Exam reveals no gallop and no friction rub.  No murmur heard.  Pulmonary/Chest: Effort normal and breath sounds normal. No respiratory distress. He has no wheezes.  Abdominal: Soft. Bowel sounds are normal. He exhibits no distension. There is no tenderness.  Lymphadenopathy:  He has no cervical adenopathy.  Neurological: He is alert and oriented to person, place, and time.  Skin: Skin is warm and dry. No rash noted. No erythema.  Psychiatric: He has a normal mood and affect. His behavior is normal.    Lab Results  Component Value Date   CD4TCELL 26* 12/18/2014   Lab Results  Component Value Date   CD4TABS 530 12/18/2014   CD4TABS 180* 10/31/2014   Lab Results  Component Value Date   HIV1RNAQUANT 73* 12/18/2014   No results found for: HEPBSAB No results found for: RPR  CBC Lab Results  Component Value Date   WBC 4.3  12/18/2014   RBC 5.20 12/18/2014   HGB 15.0 12/18/2014   HCT 43.0 12/18/2014   PLT 221 12/18/2014   MCV 82.7 12/18/2014   MCH 28.8 12/18/2014   MCHC 34.9 12/18/2014   RDW  14.8 12/18/2014   LYMPHSABS 2.2 12/18/2014   MONOABS 0.4 12/18/2014   EOSABS 0.1 12/18/2014   BASOSABS 0.0 12/18/2014   BMET Lab Results  Component Value Date   NA 140 12/18/2014   K 4.4 12/18/2014   CL 105 12/18/2014   CO2 24 12/18/2014   GLUCOSE 80 12/18/2014   BUN 12 12/18/2014   CREATININE 1.12 12/18/2014   CALCIUM 9.6 12/18/2014   GFRNONAA >89 11/09/2014   GFRAA >89 11/09/2014     Assessment and Plan  hiv disease = labs at 1 month show great response to therapy. Likely undetectable by now. We will check his labs in 2 months ( 3 month mark)  Hep b = we will check hepatitis B viral load to see response to treatment. Continue to take meds. Will also vaccinate him for hep A. Will check CMP to ensure ALT/AST back to baseline  Access to meds = likely can refill meds

## 2015-01-11 LAB — HEPATITIS B DNA, ULTRAQUANTITATIVE, PCR: Hepatitis B DNA: <20 IU/mL (ref <20)

## 2015-02-20 ENCOUNTER — Telehealth: Payer: Self-pay | Admitting: *Deleted

## 2015-02-20 NOTE — Telephone Encounter (Signed)
Patient called c/o runny nose, diarrhea and fever the past few days. His Mom wanted him to be seen; but he feels he is improving and no longer feels feverish. He still has some diarrhea, but that is improving also. Advised patient to give us a call if he worsens.  Reminded him that we do not see patient's on Friday morning, as there is no provider scheduled.

## 2015-02-21 ENCOUNTER — Telehealth: Payer: Self-pay | Admitting: *Deleted

## 2015-02-21 NOTE — Telephone Encounter (Signed)
Patient called back today, stating he was still feeling the same way.  He decided he would like an appointment.  He reports diarrhea and night sweats x 2 weeks, sinus congestion and productive cough (white phlegm) for 2 weeks.  He did have a fever at the beginning (104 per patient), but this has resolved.   RN gave patient 1st available appointment 7/5 11:00 with Dr. Drue SecondSnider.    Diarrhea occurs daily, ranges from watery - soft, not-formed stool.  He is having no incontinence or difficulty eating. No nausea or vomiting. For his respiratory symptoms he is taking loratadine (he is allergic to zyrtec), OTC cold/cough gel caps, and mucinex DM at night but feels it is not working.  He takes ibuprofen for his headache.  He reports still having a feeling of fullness in his face and ears as well as a headache in the front of his head.  RN advised patient to stop the loratadine, cold/cough gel caps, Mucinex DM.  Advised he could try allegra-D for allergies and sinus congestion, saline spray for his nose, ibuprofen for swelling and headache.  RN advised the patient to follow the directions on the bottles and to stay hydrated.  He is advised to go to urgent care or the ED if his symptoms get worse.  Patient verbalized understanding, agreement. Andree CossHowell, Drew Herman M, RN

## 2015-02-26 ENCOUNTER — Ambulatory Visit: Payer: Self-pay | Admitting: Internal Medicine

## 2015-04-02 ENCOUNTER — Ambulatory Visit: Payer: Self-pay | Admitting: Internal Medicine

## 2015-06-03 ENCOUNTER — Ambulatory Visit: Payer: Self-pay

## 2015-06-03 ENCOUNTER — Other Ambulatory Visit: Payer: Self-pay | Admitting: Infectious Diseases

## 2015-06-03 DIAGNOSIS — B20 Human immunodeficiency virus [HIV] disease: Secondary | ICD-10-CM

## 2015-06-03 MED ORDER — ELVITEG-COBIC-EMTRICIT-TENOFDF 150-150-200-300 MG PO TABS: 1.0000 | ORAL_TABLET | Freq: Every day | ORAL | Status: DC

## 2015-06-11 ENCOUNTER — Ambulatory Visit: Payer: Self-pay | Admitting: Internal Medicine

## 2015-07-02 ENCOUNTER — Other Ambulatory Visit: Payer: Self-pay | Admitting: *Deleted

## 2015-07-02 DIAGNOSIS — B20 Human immunodeficiency virus [HIV] disease: Secondary | ICD-10-CM

## 2015-07-02 MED ORDER — ELVITEG-COBIC-EMTRICIT-TENOFDF 150-150-200-300 MG PO TABS: 1.0000 | ORAL_TABLET | Freq: Every day | ORAL | Status: DC

## 2015-07-08 ENCOUNTER — Encounter: Payer: Self-pay | Admitting: Internal Medicine

## 2015-07-08 ENCOUNTER — Ambulatory Visit (INDEPENDENT_AMBULATORY_CARE_PROVIDER_SITE_OTHER): Payer: Self-pay | Admitting: Internal Medicine

## 2015-07-08 VITALS — BP 146/75 | HR 61 | Temp 97.5°F | Wt 220.0 lb

## 2015-07-08 DIAGNOSIS — B181 Chronic viral hepatitis B without delta-agent: Secondary | ICD-10-CM

## 2015-07-08 DIAGNOSIS — Z113 Encounter for screening for infections with a predominantly sexual mode of transmission: Secondary | ICD-10-CM

## 2015-07-08 DIAGNOSIS — B20 Human immunodeficiency virus [HIV] disease: Secondary | ICD-10-CM

## 2015-07-08 DIAGNOSIS — Z23 Encounter for immunization: Secondary | ICD-10-CM

## 2015-07-08 DIAGNOSIS — R369 Urethral discharge, unspecified: Secondary | ICD-10-CM

## 2015-07-08 DIAGNOSIS — Z7251 High risk heterosexual behavior: Secondary | ICD-10-CM

## 2015-07-08 LAB — CBC WITH DIFFERENTIAL/PLATELET
BASOS ABS: 0.1 10*3/uL (ref 0.0–0.1)
Basophils Relative: 2 % — ABNORMAL HIGH (ref 0–1)
EOS PCT: 1 % (ref 0–5)
Eosinophils Absolute: 0 10*3/uL (ref 0.0–0.7)
HEMATOCRIT: 45.3 % (ref 39.0–52.0)
Hemoglobin: 16.1 g/dL (ref 13.0–17.0)
LYMPHS ABS: 1.2 10*3/uL (ref 0.7–4.0)
LYMPHS PCT: 37 % (ref 12–46)
MCH: 29.1 pg (ref 26.0–34.0)
MCHC: 35.5 g/dL (ref 30.0–36.0)
MCV: 81.8 fL (ref 78.0–100.0)
MPV: 10.1 fL (ref 8.6–12.4)
Monocytes Absolute: 0.5 10*3/uL (ref 0.1–1.0)
Monocytes Relative: 15 % — ABNORMAL HIGH (ref 3–12)
NEUTROS ABS: 1.4 10*3/uL — AB (ref 1.7–7.7)
Neutrophils Relative %: 45 % (ref 43–77)
Platelets: 204 10*3/uL (ref 150–400)
RBC: 5.54 MIL/uL (ref 4.22–5.81)
RDW: 13.5 % (ref 11.5–15.5)
WBC: 3.2 10*3/uL — ABNORMAL LOW (ref 4.0–10.5)

## 2015-07-08 LAB — COMPLETE METABOLIC PANEL WITH GFR
ALBUMIN: 4 g/dL (ref 3.6–5.1)
ALK PHOS: 56 U/L (ref 40–115)
ALT: 15 U/L (ref 9–46)
AST: 21 U/L (ref 10–40)
BUN: 15 mg/dL (ref 7–25)
CALCIUM: 9.8 mg/dL (ref 8.6–10.3)
CHLORIDE: 104 mmol/L (ref 98–110)
CO2: 29 mmol/L (ref 20–31)
Creat: 1.24 mg/dL (ref 0.60–1.35)
GFR, Est Non African American: 82 mL/min (ref >=60)
Glucose, Bld: 81 mg/dL (ref 65–99)
POTASSIUM: 4.2 mmol/L (ref 3.5–5.3)
Sodium: 140 mmol/L (ref 135–146)
Total Bilirubin: 0.7 mg/dL (ref 0.2–1.2)
Total Protein: 7.1 g/dL (ref 6.1–8.1)

## 2015-07-08 NOTE — Progress Notes (Signed)
Patient ID: Sean West, male   DOB: [birth year], 24 y.o.   MRN: [id number]       Patient ID: Sean West, male   DOB: [birth year], 24 y.o.   MRN: [id number]  HPI 24yo M with HIV disease-chronic Hep B, CD 4 count of 530/VL 73 (in April 2016). Was doing well on meds, off of them for 1 month due to running out of health coverage, restarted in the last 2 months without difficulty. He states that he has been in good health, except in the last 2 days he is noticing white discharge from penis. He had unprotecte dsex (brokein condom) with his significant other. No other rash, fever, chills.  Roughly 3 years [facility]  Outpatient Encounter Prescriptions as of 07/08/2015  Medication Sig  . elvitegravir-cobicistat-emtricitabine-tenofovir (STRIBILD) 150-150-200-300 MG TABS tablet Take 1 tablet by mouth daily with breakfast.  . promethazine (PHENERGAN) 25 MG tablet Take 1 tablet (25 mg total) by mouth every 6 (six) hours as needed for nausea or vomiting.   No facility-administered encounter medications on file as of 07/08/2015.     Patient Active Problem List   Diagnosis Date Noted  . HIV disease (HCC) 11/12/2014  . Acute hepatitis B 11/12/2014  . History of gonorrhea 11/12/2014  . Abnormal liver function tests 10/22/2014     Health Maintenance Due  Topic Date Due  . TETANUS/TDAP  09/09/2011  . INFLUENZA VACCINE  03/25/2015     Review of Systems Review of Systems  Constitutional: Negative for fever, chills, diaphoresis, activity change, appetite change, fatigue and unexpected weight change.  HENT: Negative for congestion, sore throat, rhinorrhea, sneezing, trouble swallowing and sinus pressure.  Eyes: Negative for photophobia and visual disturbance.  Respiratory: Negative for cough, chest tightness, shortness of breath, wheezing and stridor.  Cardiovascular: Negative for chest pain, palpitations and leg swelling.  Gastrointestinal: Negative for nausea,  vomiting, abdominal pain, diarrhea, constipation, blood in stool, abdominal distention and anal bleeding.  Genitourinary: penile discharge Musculoskeletal: Negative for myalgias, back pain, joint swelling, arthralgias and gait problem.  Skin: Negative for color change, pallor, rash and wound.  Neurological: Negative for dizziness, tremors, weakness and light-headedness.  Hematological: Negative for adenopathy. Does not bruise/bleed easily.  Psychiatric/Behavioral: Negative for behavioral problems, confusion, sleep disturbance, dysphoric mood, decreased concentration and agitation.    Physical Exam   BP 146/75 mmHg  Pulse 61  Temp(Src) 97.5 F (36.4 C) (Oral)  Wt 220 lb (99.791 kg) Physical Exam  Constitutional: He is oriented to person, place, and time. He appears well-developed and well-nourished. No distress.  HENT:  Mouth/Throat: Oropharynx is clear and moist. No oropharyngeal exudate.  Cardiovascular: Normal rate, regular rhythm and normal heart sounds. Exam reveals no gallop and no friction rub.  No murmur heard.  Pulmonary/Chest: Effort normal and breath sounds normal. No respiratory distress. He has no wheezes.  Abdominal: Soft. Bowel sounds are normal. He exhibits no distension. There is no tenderness.  Lymphadenopathy:  He has no cervical adenopathy.  Neurological: He is alert and oriented to person, place, and time.  Skin: Skin is warm and dry. No rash noted. No erythema.  Psychiatric: He has a normal mood and affect. His behavior is normal.     Lab Results  Component Value Date   CD4TCELL 26* 12/18/2014   Lab Results  Component Value Date   CD4TABS 530 12/18/2014   CD4TABS 180* 10/31/2014   Lab Results  Component Value Date   HIV1RNAQUANT 73* 12/18/2014  No results found for: HEPBSAB No results found for: RPR  CBC Lab Results  Component Value Date   WBC 4.3 12/18/2014   RBC 5.20 12/18/2014   HGB 15.0 12/18/2014   HCT 43.0 12/18/2014   PLT 221  12/18/2014   MCV 82.7 12/18/2014   MCH 28.8 12/18/2014   MCHC 34.9 12/18/2014   RDW 14.8 12/18/2014   LYMPHSABS 2.2 12/18/2014   MONOABS 0.4 12/18/2014   EOSABS 0.1 12/18/2014   BASOSABS 0.0 12/18/2014   BMET Lab Results  Component Value Date   NA 140 01/07/2015   K 4.6 01/07/2015   CL 103 01/07/2015   CO2 28 01/07/2015   GLUCOSE 91 01/07/2015   BUN 12 01/07/2015   CREATININE 1.39* 01/07/2015   CALCIUM 9.8 01/07/2015   GFRNONAA 71 01/07/2015   GFRAA 83 01/07/2015     Assessment and Plan  hiv disease = we will continue on stribild, in addition we will check labs today  Health maintenance = will give hep a #2 and hpv #1, and flu  Penile discharge = will treat for presumed gc/chlam with ctx  IM, 1gm azithromycin,  in addition to testing urethral, anal and pharyngeal specimens in addition to testing for rpr  Unprotected sex = will give condoms  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 For now, likely slightly elevated due to

## 2015-07-09 DIAGNOSIS — Z23 Encounter for immunization: Secondary | ICD-10-CM

## 2015-07-09 LAB — T-HELPER CELL (CD4) - (RCID CLINIC ONLY)
CD4 T CELL ABS: 370 /uL — AB (ref 400–2700)
CD4 T CELL HELPER: 28 % — AB (ref 33–55)

## 2015-07-09 LAB — CYTOLOGY, (ORAL, ANAL, URETHRAL) ANCILLARY ONLY
CHLAMYDIA, DNA PROBE: POSITIVE — AB
Chlamydia: NEGATIVE
Chlamydia: NEGATIVE
NEISSERIA GONORRHEA: POSITIVE — AB
Neisseria Gonorrhea: NEGATIVE
Neisseria Gonorrhea: POSITIVE — AB

## 2015-07-09 LAB — RPR

## 2015-07-09 MED ORDER — AZITHROMYCIN 250 MG PO TABS: 1000.0000 mg | ORAL_TABLET | Freq: Every day | ORAL | Status: DC

## 2015-07-09 MED ORDER — AZITHROMYCIN 250 MG PO TABS
1000.0000 mg | ORAL_TABLET | Freq: Once | ORAL | Status: AC
  Administered 2015-07-08: 1000 mg via ORAL

## 2015-07-09 MED ORDER — CEFTRIAXONE SODIUM 1 G IJ SOLR
250.0000 mg | Freq: Once | INTRAMUSCULAR | Status: AC
  Administered 2015-07-08: 250 mg via INTRAMUSCULAR

## 2015-07-09 NOTE — Addendum Note (Signed)
Addended by: Alesia MorinPOOLE, Philippe Gang F on: 07/09/2015 02:20 PM   Modules accepted: Orders

## 2015-07-10 ENCOUNTER — Telehealth: Payer: Self-pay | Admitting: *Deleted

## 2015-07-10 LAB — HIV-1 RNA QUANT-NO REFLEX-BLD: HIV-1 RNA Quant, Log: <1.3 Log copies/mL (ref <1.30)

## 2015-07-10 NOTE — Telephone Encounter (Signed)
Let him know that his partner needs to get treated for Gonorrhea and chlamydia at [facility], otherwise they will continuously reinfect each other. thx

## 2015-07-10 NOTE — Telephone Encounter (Signed)
Patient called and advised he was positive for GC/Chlymadia but that he was already treated at his visit. Reminded him to use condoms every time and to keep his follow up appt next month.

## 2015-07-22 LAB — HEPATITIS B DNA, ULTRAQUANTITATIVE, PCR: Hepatitis B DNA: <20 IU/mL (ref <20)

## 2015-07-29 LAB — T-HELPER CELL (CD4) - (RCID CLINIC ONLY)

## 2015-08-08 ENCOUNTER — Ambulatory Visit: Payer: Self-pay

## 2015-09-30 ENCOUNTER — Other Ambulatory Visit: Payer: Self-pay

## 2015-10-14 ENCOUNTER — Ambulatory Visit: Payer: Self-pay | Admitting: Internal Medicine

## 2015-10-22 ENCOUNTER — Ambulatory Visit: Payer: Self-pay | Admitting: Internal Medicine

## 2016-01-11 IMAGING — US US ABDOMEN LIMITED
1 series · 14 of 25 positions shown · non-contrast
Comparison: None.

CLINICAL DATA: Transaminitis, hyperbilirubinemia.

EXAM:
US ABDOMEN LIMITED - RIGHT UPPER QUADRANT

[Series 1: us abdomen limited · 0.21mm/px · 14 of 54 slices shown]
[im 1/54]
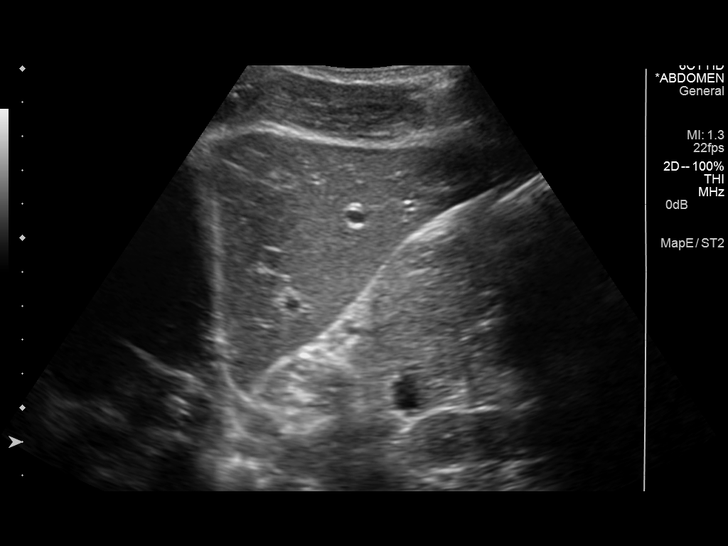
[im 5/54]
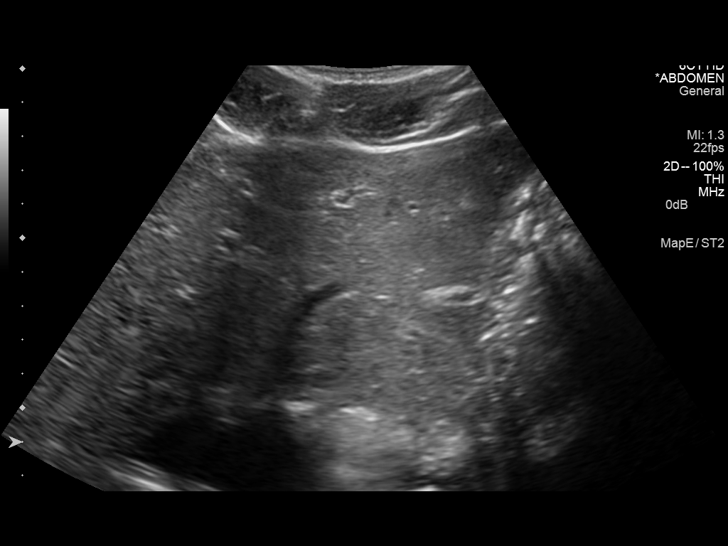
[im 9/54]
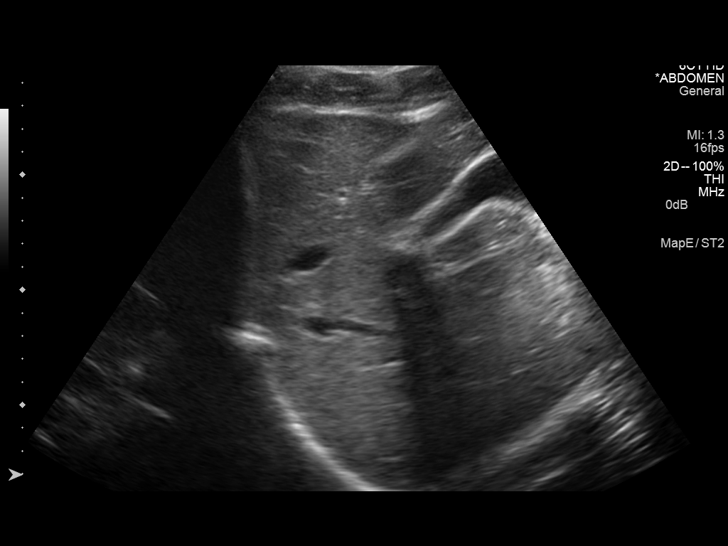
[im 14/54]
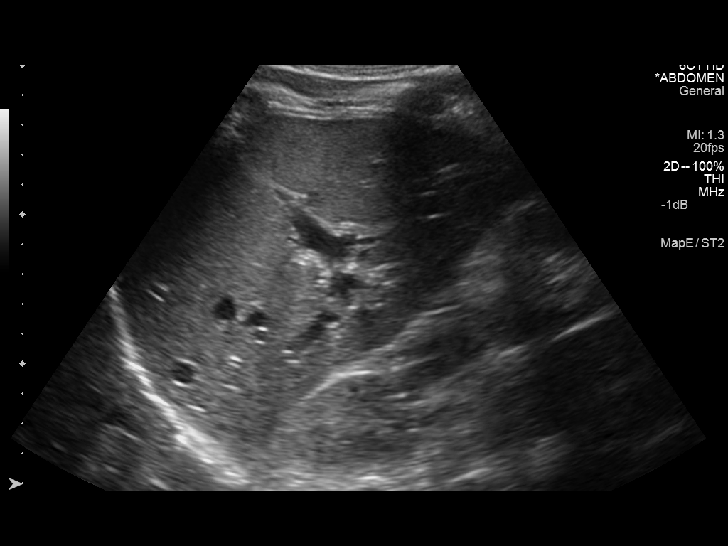
[im 18/54]
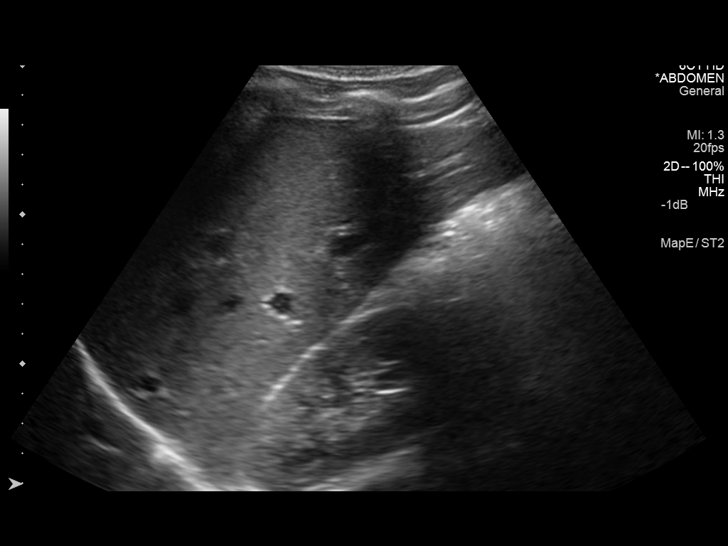
[im 20/54]
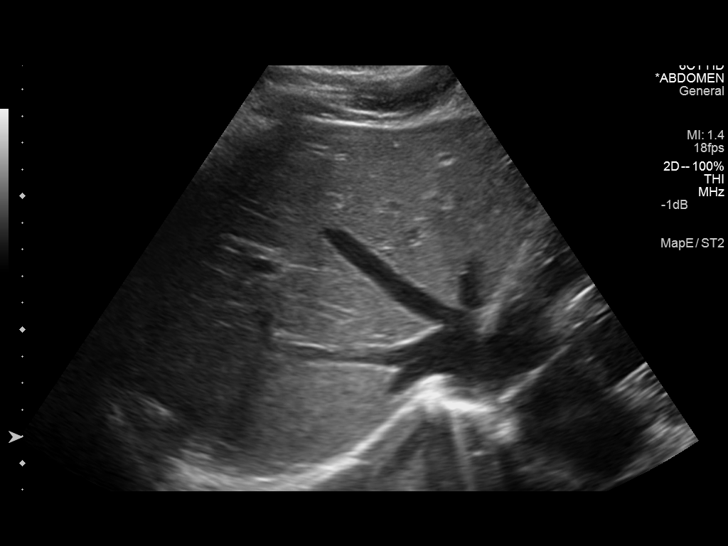
[im 25/54]
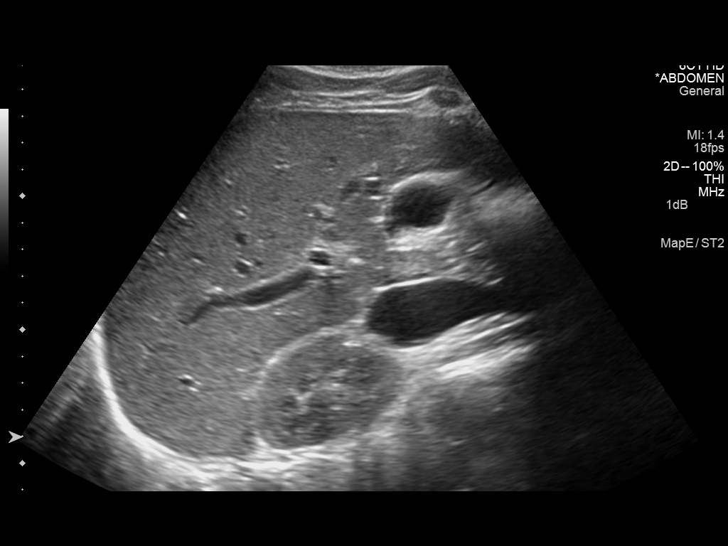
[im 29/54]
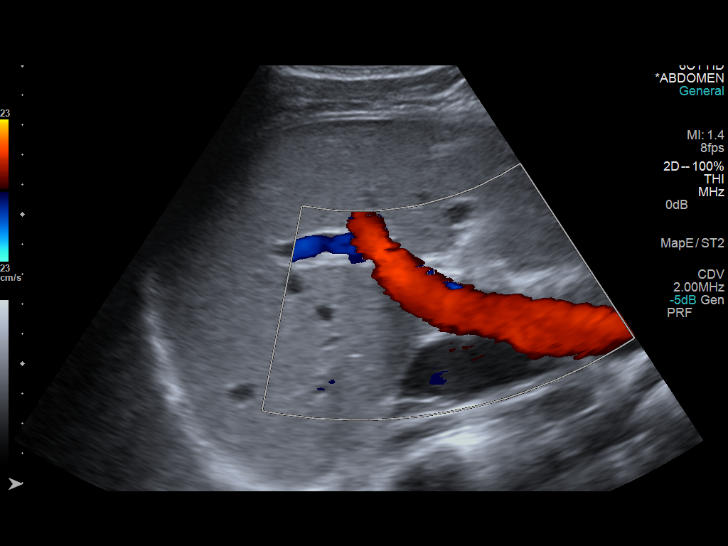
[im 34/54]
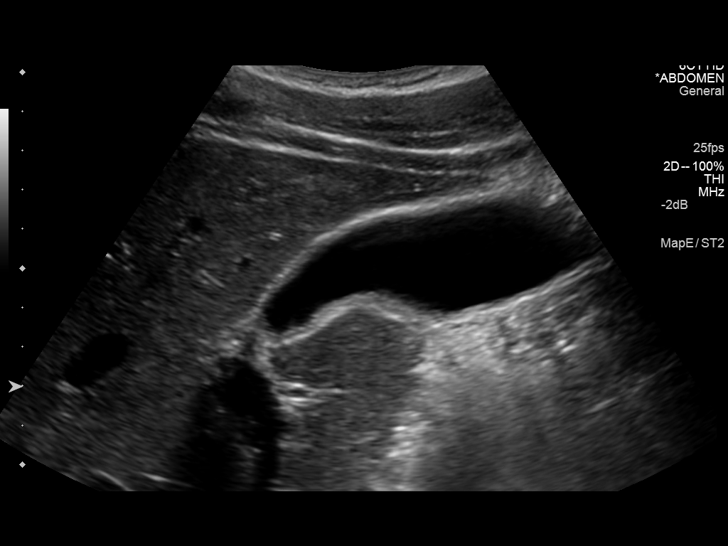
[im 36/54]
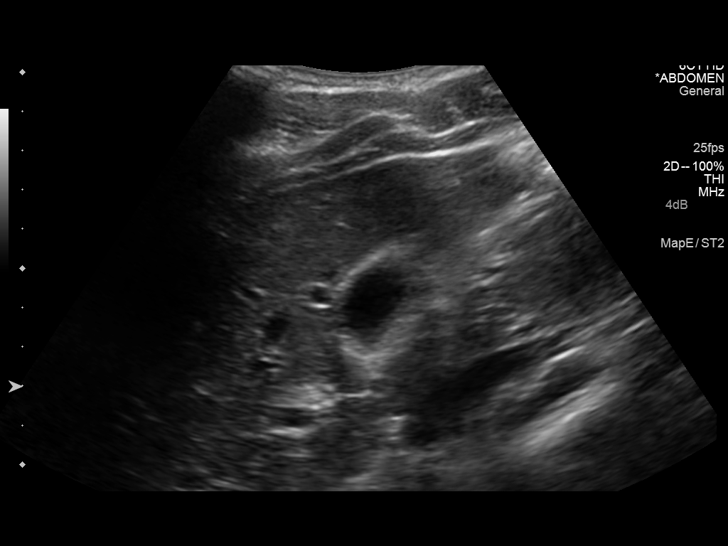
[im 40/54]
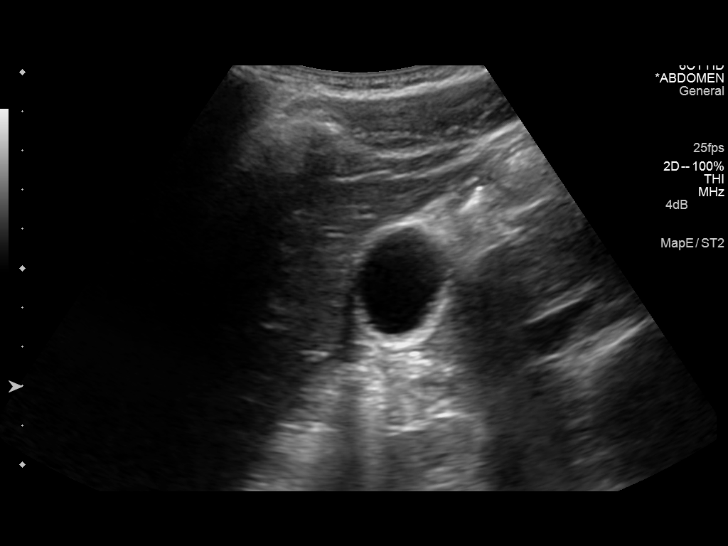
[im 45/54]
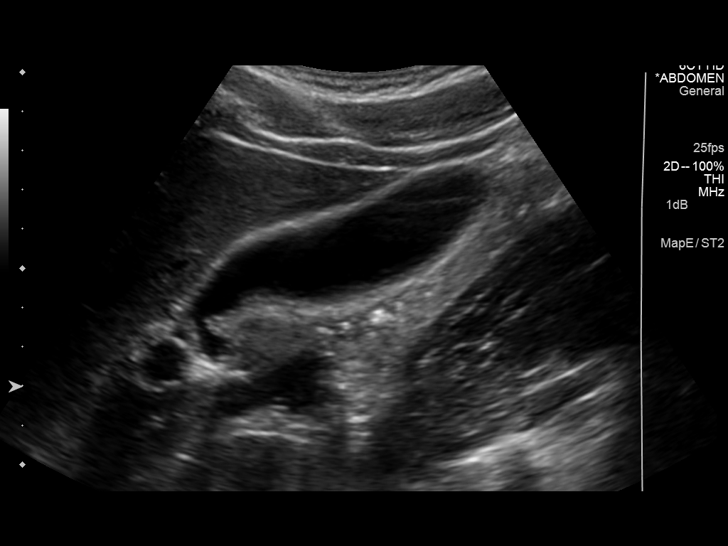
[im 49/54]
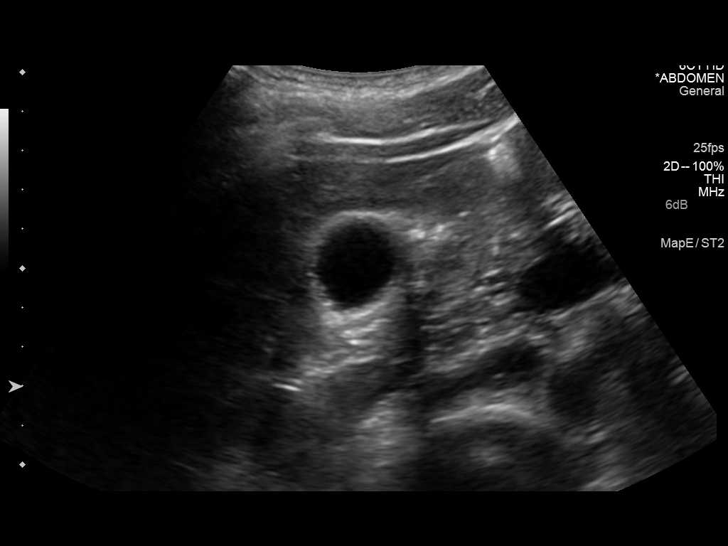
[im 54/54]
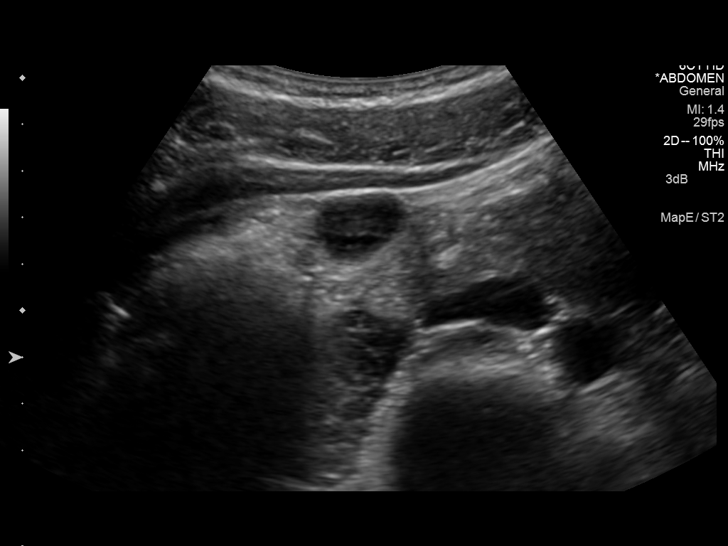

[14 of 25 positions shown; findings below may reference images not displayed]

FINDINGS: Gallbladder:

No gallstones or wall thickening visualized. No sonographic Murphy
sign noted.

Common bile duct:

Diameter: 3 mm

Liver:

No focal lesion identified. Within normal limits in parenchymal
echogenicity. Hepatopetal portal vein.
IMPRESSION: Normal RIGHT upper quadrant ultrasound.

  By: Ansano Razvan

## 2016-01-29 ENCOUNTER — Encounter: Payer: Self-pay | Admitting: Internal Medicine

## 2016-03-10 ENCOUNTER — Other Ambulatory Visit: Payer: Self-pay

## 2016-03-10 ENCOUNTER — Other Ambulatory Visit (INDEPENDENT_AMBULATORY_CARE_PROVIDER_SITE_OTHER): Payer: Self-pay

## 2016-03-10 DIAGNOSIS — B20 Human immunodeficiency virus [HIV] disease: Secondary | ICD-10-CM

## 2016-03-10 LAB — CBC WITH DIFFERENTIAL/PLATELET
BASOS ABS: 32 {cells}/uL (ref 0–200)
Basophils Relative: 1 %
EOS ABS: 32 {cells}/uL (ref 15–500)
EOS PCT: 1 %
HEMATOCRIT: 44.1 % (ref 38.5–50.0)
HEMOGLOBIN: 14.9 g/dL (ref 13.2–17.1)
LYMPHS ABS: 1472 {cells}/uL (ref 850–3900)
Lymphocytes Relative: 46 %
MCH: 27.7 pg (ref 27.0–33.0)
MCHC: 33.8 g/dL (ref 32.0–36.0)
MCV: 82 fL (ref 80.0–100.0)
MONO ABS: 448 {cells}/uL (ref 200–950)
MPV: 9.8 fL (ref 7.5–12.5)
Monocytes Relative: 14 %
NEUTROS ABS: 1216 {cells}/uL — AB (ref 1500–7800)
NEUTROS PCT: 38 %
Platelets: 240 10*3/uL (ref 140–400)
RBC: 5.38 MIL/uL (ref 4.20–5.80)
RDW: 13.2 % (ref 11.0–15.0)
WBC: 3.2 10*3/uL — ABNORMAL LOW (ref 3.8–10.8)

## 2016-03-11 LAB — COMPREHENSIVE METABOLIC PANEL
ALK PHOS: 56 U/L (ref 40–115)
ALT: 10 U/L (ref 9–46)
AST: 18 U/L (ref 10–40)
Albumin: 3.8 g/dL (ref 3.6–5.1)
BILIRUBIN TOTAL: 0.6 mg/dL (ref 0.2–1.2)
BUN: 12 mg/dL (ref 7–25)
CALCIUM: 9.1 mg/dL (ref 8.6–10.3)
CO2: 25 mmol/L (ref 20–31)
Chloride: 105 mmol/L (ref 98–110)
Creat: 1.33 mg/dL (ref 0.60–1.35)
GLUCOSE: 92 mg/dL (ref 65–99)
Potassium: 4.3 mmol/L (ref 3.5–5.3)
Sodium: 138 mmol/L (ref 135–146)
TOTAL PROTEIN: 7.3 g/dL (ref 6.1–8.1)

## 2016-03-11 LAB — T-HELPER CELL (CD4) - (RCID CLINIC ONLY)
CD4 % Helper T Cell: 25 % — ABNORMAL LOW (ref 33–55)
CD4 T Cell Abs: 400 /uL (ref 400–2700)

## 2016-03-12 LAB — HIV-1 RNA QUANT-NO REFLEX-BLD
HIV 1 RNA QUANT: 111 {copies}/mL — AB (ref <20)
HIV-1 RNA Quant, Log: 2.05 Log copies/mL — ABNORMAL HIGH (ref <1.30)

## 2016-03-17 ENCOUNTER — Encounter: Payer: Self-pay | Admitting: Internal Medicine

## 2016-03-17 ENCOUNTER — Ambulatory Visit (INDEPENDENT_AMBULATORY_CARE_PROVIDER_SITE_OTHER): Payer: Self-pay | Admitting: Internal Medicine

## 2016-03-17 VITALS — BP 136/80 | HR 67 | Temp 98.8°F | Ht 72.0 in | Wt 210.0 lb

## 2016-03-17 DIAGNOSIS — Z23 Encounter for immunization: Secondary | ICD-10-CM

## 2016-03-17 NOTE — Progress Notes (Signed)
Patient ID: Sean West, male   DOB: 1991-01-07, 25 y.o.   MRN: [id number]     RFV: hiv visit  Patient ID: Sean West, male   DOB: 20-Jul-1991, 25 y.o.   MRN: [id number]  HPI 25yo M with HIV-HBV co infection, has been out of meds for 2 wk. His last CD 4 count of 400/VL 111, currently on stribild. He states that he didn't realize that his insurance was running out and could come to clinic to get quick access.he has not noticed any episodes of right upper quadrant pain or jaundice  Outpatient Encounter Prescriptions as of 03/17/2016  Medication Sig  . elvitegravir-cobicistat-emtricitabine-tenofovir (STRIBILD) 150-150-200-300 MG TABS tablet Take 1 tablet by mouth daily with breakfast.  . promethazine (PHENERGAN) 25 MG tablet Take 1 tablet (25 mg total) by mouth every 6 (six) hours as needed for nausea or vomiting. (Patient not taking: Reported on 03/17/2016)   No facility-administered encounter medications on file as of 03/17/2016.      Patient Active Problem List   Diagnosis Date Noted  . HIV disease (HCC) 11/12/2014  . Acute hepatitis B 11/12/2014  . History of gonorrhea 11/12/2014  . Abnormal liver function tests 10/22/2014     Health Maintenance Due  Topic Date Due  . TETANUS/TDAP  09/09/2011     Review of Systems 10 point ros is negative Physical Exam   BP 136/80   Pulse 67   Temp 98.8 F (37.1 C) (Oral)   Ht 6' (1.829 m)   Wt 210 lb (95.3 kg)   BMI 28.48 kg/m  Physical Exam  Constitutional: He is oriented to person, place, and time. He appears well-developed and well-nourished. No distress.  HENT:  Mouth/Throat: Oropharynx is clear and moist. No oropharyngeal exudate.  Cardiovascular: Normal rate, regular rhythm and normal heart sounds. Exam reveals no gallop and no friction rub.  No murmur heard.  Pulmonary/Chest: Effort normal and breath sounds normal. No respiratory distress. He has no wheezes.  Abdominal: Soft. Bowel sounds are normal. He  exhibits no distension. There is no tenderness.  Lymphadenopathy:  He has no cervical adenopathy.  Neurological: He is alert and oriented to person, place, and time.  Skin: Skin is warm and dry. No rash noted. No erythema.  Psychiatric: He has a normal mood and affect. His behavior is normal.    Lab Results  Component Value Date   CD4TCELL 25 (L) 03/10/2016   Lab Results  Component Value Date   CD4TABS 400 03/10/2016   CD4TABS 370 (L) 07/08/2015   CD4TABS TEST REQUEST RECEIVED WITHOUT APPROPRIATE SPECIMEN 07/08/2015   Lab Results  Component Value Date   HIV1RNAQUANT 111 (H) 03/10/2016   No results found for: HEPBSAB No results found for: RPR  CBC Lab Results  Component Value Date   WBC 3.2 (L) 03/10/2016   RBC 5.38 03/10/2016   HGB 14.9 03/10/2016   HCT 44.1 03/10/2016   PLT 240 03/10/2016   MCV 82.0 03/10/2016   MCH 27.7 03/10/2016   MCHC 33.8 03/10/2016   RDW 13.2 03/10/2016   LYMPHSABS 1,472 03/10/2016   MONOABS 448 03/10/2016   EOSABS 32 03/10/2016   BASOSABS 32 03/10/2016   BMET Lab Results  Component Value Date   NA 138 03/10/2016   K 4.3 03/10/2016   CL 105 03/10/2016   CO2 25 03/10/2016   GLUCOSE 92 03/10/2016   BUN 12 03/10/2016   CREATININE 1.33 03/10/2016   CALCIUM 9.1 03/10/2016   GFRNONAA 82 07/08/2015   GFRAA >  89 07/08/2015     Assessment and Plan  hiv disease= will need to get him back on HIV regimen, stribild or genvoya. Check VL at next visit  Chronic hepatitis b without hepatic coma = will get him back on treatment and check VL at next visit  Health maintennace =flu vax in the fall

## 2016-03-25 ENCOUNTER — Encounter: Payer: Self-pay | Admitting: Internal Medicine

## 2016-04-13 ENCOUNTER — Telehealth: Payer: Self-pay

## 2016-04-13 NOTE — Telephone Encounter (Signed)
Patient called complaining of fatigue and his throat being swollen for about 4 or 5 days. Denies fevers and sweats.  Patient denies missing any doses of medication. Was calling to figure out what to do. Patient stated that he has been drinking tea with honey to soothe throat. Patient stated he doesn't know what it is or if he may have strep.  Advised since his appointment is not until 09/14 that he should go to urgent care to get evaluated quicker. Patient does not have a PCP. Explained to patient LPN will make Dr. Drue SecondSnider aware of his phone call. Patient stated understanding and will go to urgent care. Rejeana Brockandace Charrisse Masley, LPN

## 2016-05-07 ENCOUNTER — Encounter: Payer: Self-pay | Admitting: Internal Medicine

## 2016-05-07 ENCOUNTER — Ambulatory Visit (INDEPENDENT_AMBULATORY_CARE_PROVIDER_SITE_OTHER): Payer: Self-pay | Admitting: Internal Medicine

## 2016-05-07 VITALS — BP 135/82 | HR 65 | Temp 98.5°F | Wt 202.0 lb

## 2016-05-07 DIAGNOSIS — Z23 Encounter for immunization: Secondary | ICD-10-CM

## 2016-05-07 DIAGNOSIS — R21 Rash and other nonspecific skin eruption: Secondary | ICD-10-CM

## 2016-05-07 DIAGNOSIS — A539 Syphilis, unspecified: Secondary | ICD-10-CM

## 2016-05-07 DIAGNOSIS — B20 Human immunodeficiency virus [HIV] disease: Secondary | ICD-10-CM

## 2016-05-07 DIAGNOSIS — Z113 Encounter for screening for infections with a predominantly sexual mode of transmission: Secondary | ICD-10-CM

## 2016-05-07 LAB — COMPLETE METABOLIC PANEL WITH GFR
ALBUMIN: 4.1 g/dL (ref 3.6–5.1)
ALK PHOS: 60 U/L (ref 40–115)
ALT: 12 U/L (ref 9–46)
AST: 19 U/L (ref 10–40)
BILIRUBIN TOTAL: 0.8 mg/dL (ref 0.2–1.2)
BUN: 9 mg/dL (ref 7–25)
CALCIUM: 9.5 mg/dL (ref 8.6–10.3)
CO2: 28 mmol/L (ref 20–31)
Chloride: 103 mmol/L (ref 98–110)
Creat: 1.33 mg/dL (ref 0.60–1.35)
GFR, EST AFRICAN AMERICAN: 86 mL/min (ref >=60)
GFR, EST NON AFRICAN AMERICAN: 75 mL/min (ref >=60)
Glucose, Bld: 88 mg/dL (ref 65–99)
Potassium: 4.4 mmol/L (ref 3.5–5.3)
Sodium: 137 mmol/L (ref 135–146)
TOTAL PROTEIN: 7.7 g/dL (ref 6.1–8.1)

## 2016-05-07 LAB — CBC WITH DIFFERENTIAL/PLATELET
BASOS ABS: 26 {cells}/uL (ref 0–200)
Basophils Relative: 1 %
EOS ABS: 104 {cells}/uL (ref 15–500)
Eosinophils Relative: 4 %
HCT: 43.5 % (ref 38.5–50.0)
Hemoglobin: 15 g/dL (ref 13.2–17.1)
Lymphocytes Relative: 37 %
Lymphs Abs: 962 cells/uL (ref 850–3900)
MCH: 27.6 pg (ref 27.0–33.0)
MCHC: 34.5 g/dL (ref 32.0–36.0)
MCV: 80 fL (ref 80.0–100.0)
MONOS PCT: 19 %
MPV: 10.2 fL (ref 7.5–12.5)
Monocytes Absolute: 494 cells/uL (ref 200–950)
NEUTROS ABS: 1014 {cells}/uL — AB (ref 1500–7800)
Neutrophils Relative %: 39 %
PLATELETS: 264 10*3/uL (ref 140–400)
RBC: 5.44 MIL/uL (ref 4.20–5.80)
RDW: 13.4 % (ref 11.0–15.0)
WBC: 2.6 10*3/uL — ABNORMAL LOW (ref 3.8–10.8)

## 2016-05-07 MED ORDER — DOLUTEGRAVIR SODIUM 50 MG PO TABS: 50.0000 mg | ORAL_TABLET | Freq: Every day | ORAL | 11 refills | Status: DC

## 2016-05-07 MED ORDER — EMTRICITABINE-TENOFOVIR AF 200-25 MG PO TABS: 1.0000 | ORAL_TABLET | Freq: Every day | ORAL | 11 refills | Status: DC

## 2016-05-07 MED ORDER — CLOTRIMAZOLE 1 % EX CREA: 1.0000 "application " | TOPICAL_CREAM | Freq: Two times a day (BID) | CUTANEOUS | 0 refills | Status: DC

## 2016-05-07 MED ORDER — TERBINAFINE HCL 250 MG PO TABS: 250.0000 mg | ORAL_TABLET | Freq: Every day | ORAL | 0 refills | Status: DC

## 2016-05-07 NOTE — Patient Instructions (Signed)
Start with cream to affected area, twice a day, if not improved in 7 days then take tablets, terbinafine daily for 14 days

## 2016-05-07 NOTE — Progress Notes (Signed)
RFV: hiv disease  Patient ID: Sean West, male   DOB: Mar 12, 1991, 25 y.o.   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 Not missing doses  Recently increased working out to twice a day, having scrotal and penile rash, not itchy described as flat circles, not vesicular, no pain Has not used any antifungal creams  Also has had intermittent sore throat  Currently in a relationship with one partner, they use condoms, his partner is on prep  Outpatient Encounter Prescriptions as of 05/07/2016  Medication Sig  . elvitegravir-cobicistat-emtricitabine-tenofovir (STRIBILD) 150-150-200-300 MG TABS tablet Take 1 tablet by mouth daily with breakfast.  . promethazine (PHENERGAN) 25 MG tablet Take 1 tablet (25 mg total) by mouth every 6 (six) hours as needed for nausea or vomiting. (Patient not taking: Reported on 03/17/2016)   No facility-administered encounter medications on file as of 05/07/2016.      Patient Active Problem List   Diagnosis Date Noted  . HIV disease (HCC) 11/12/2014  . Acute hepatitis B 11/12/2014  . History of gonorrhea 11/12/2014  . Abnormal liver function tests 10/22/2014  - hx of perforated appendcitis   Health Maintenance Due  Topic Date Due  . TETANUS/TDAP  09/09/2011  . INFLUENZA VACCINE  03/24/2016     Review of Systems +scrotal, and penile rash Physical Exam   BP 135/82   Pulse 65   Temp 98.5 F (36.9 C) (Oral)   Wt 202 lb (91.6 kg)   SpO2 99%   BMI 27.40 kg/m  Physical Exam  Constitutional: He is oriented to person, place, and time. He appears well-developed and well-nourished. No distress.  HENT: prominent tonsillar tissue, no exudate Mouth/Throat: Oropharynx is clear and moist. No oropharyngeal exudate.  Cardiovascular: Normal rate, regular rhythm and normal heart sounds. Exam reveals no gallop and no friction rub.  No murmur  heard.  Pulmonary/Chest: Effort normal and breath sounds normal. No respiratory distress. He has no wheezes.  Abdominal: Soft. Bowel sounds are normal. He exhibits no distension. There is no tenderness.  Gu: rectal exam shows external hemorrhoids. Scrotum and shaft of penis has numerous 1cm flat macular lesions c/w tinea Lymphadenopathy: +right inguinal lymphadenopathy He has no cervical adenopathy.  Neurological: He is alert and oriented to person, place, and time.  Skin: Skin is warm and dry. No rash noted. No erythema.  Psychiatric: He has a normal mood and affect. His behavior is normal.     Lab Results  Component Value Date   CD4TCELL 25 (L) 03/10/2016   Lab Results  Component Value Date   CD4TABS 400 03/10/2016   CD4TABS 370 (L) 07/08/2015   CD4TABS TEST REQUEST RECEIVED WITHOUT APPROPRIATE SPECIMEN 07/08/2015   Lab Results  Component Value Date   HIV1RNAQUANT 111 (H) 03/10/2016   No results found for: HEPBSAB No results found for: RPR  CBC Lab Results  Component Value Date   WBC 3.2 (L) 03/10/2016   RBC 5.38 03/10/2016   HGB 14.9 03/10/2016   HCT 44.1 03/10/2016   PLT 240 03/10/2016   MCV 82.0 03/10/2016   MCH 27.7 03/10/2016   MCHC 33.8 03/10/2016   RDW 13.2 03/10/2016   LYMPHSABS 1,472 03/10/2016   MONOABS 448 03/10/2016   EOSABS 32 03/10/2016   BASOSABS 32 03/10/2016   BMET Lab Results  Component Value Date   NA 138 03/10/2016   K 4.3 03/10/2016  CL 105 03/10/2016   CO2 25 03/10/2016   GLUCOSE 92 03/10/2016   BUN 12 03/10/2016   CREATININE 1.33 03/10/2016   CALCIUM 9.1 03/10/2016   GFRNONAA 82 07/08/2015   GFRAA >89 07/08/2015     Assessment and Plan  Penile/scrotal rash =  Will test for STD, possibly syphilis +/- tinea like rash. will give antifungal cream, clotrimazole 1% bid, if not improved will do terbinafine 250mg  daily x 14d  hiv disease= has low level viremia. Will repeat viral load to see if need to change regimen. Continue on  tivicay and truvada  Chronic hepatitis b = will check viral load as well. 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 Recommended for him to get PCN injection for which he will come back to clinic, as well as treat his sexual partner

## 2016-05-08 LAB — FLUORESCENT TREPONEMAL AB(FTA)-IGG-BLD: FLUORESCENT TREPONEMAL ABS: REACTIVE — AB

## 2016-05-08 LAB — CYTOLOGY, (ORAL, ANAL, URETHRAL) ANCILLARY ONLY
CHLAMYDIA, DNA PROBE: NEGATIVE
Chlamydia: NEGATIVE
NEISSERIA GONORRHEA: NEGATIVE
Neisseria Gonorrhea: NEGATIVE

## 2016-05-08 LAB — RPR: RPR Ser Ql: REACTIVE — AB

## 2016-05-08 LAB — HIV-1 RNA ULTRAQUANT REFLEX TO GENTYP+: HIV-1 RNA Quant, Log: <1.3 Log copies/mL (ref <1.30)

## 2016-05-08 LAB — RPR TITER: RPR Titer: 1:64 {titer} — AB

## 2016-05-09 LAB — CULTURE, GROUP A STREP

## 2016-05-11 ENCOUNTER — Telehealth: Payer: Self-pay | Admitting: *Deleted

## 2016-05-11 LAB — HEPATITIS B DNA, ULTRAQUANTITATIVE, PCR

## 2016-05-11 NOTE — Telephone Encounter (Signed)
-----   Message from Judyann Munsonynthia Snider, MD sent at 05/08/2016 11:52 AM EDT ----- Can we get him in for bicillin 2.4MU x 1. I will call him to let him know

## 2016-05-11 NOTE — Telephone Encounter (Signed)
Per Dr Drue SecondSnider called the patient to schedule his treatment for +RPR. Patient coming 05/12/16 at 9 am.

## 2016-05-12 ENCOUNTER — Other Ambulatory Visit: Payer: Self-pay

## 2016-05-12 ENCOUNTER — Ambulatory Visit (INDEPENDENT_AMBULATORY_CARE_PROVIDER_SITE_OTHER): Payer: Self-pay

## 2016-05-12 DIAGNOSIS — A539 Syphilis, unspecified: Secondary | ICD-10-CM

## 2016-05-12 MED ORDER — PENICILLIN G BENZATHINE 1200000 UNIT/2ML IM SUSP
1.2000 10*6.[IU] | Freq: Once | INTRAMUSCULAR | Status: AC
  Administered 2016-05-12: 1.2 10*6.[IU] via INTRAMUSCULAR

## 2016-05-12 MED ORDER — ELVITEG-COBIC-EMTRICIT-TENOFDF 150-150-200-300 MG PO TABS: 1.0000 | ORAL_TABLET | Freq: Every day | ORAL | 1 refills | Status: DC

## 2016-05-12 NOTE — Progress Notes (Signed)
Patient in per Dr. Drue SecondSnider orders to receive 2.4 mil units  of Bicillin. One injection on the right upper outer bottock  and one injection on the left upper outer buttock. Tolerated well. Walked out of clinic. Rejeana Brockandace Murray, LPN

## 2016-05-12 NOTE — Telephone Encounter (Signed)
Patient states he was given a different regimen while he was without insurance.  The plan was to return to Stribild once his insurance restarted. He is now ready to restart Stribild.   I will send medication to pharmacy .

## 2016-08-11 ENCOUNTER — Encounter: Payer: Self-pay | Admitting: Internal Medicine

## 2016-08-11 ENCOUNTER — Ambulatory Visit (INDEPENDENT_AMBULATORY_CARE_PROVIDER_SITE_OTHER): Payer: Self-pay | Admitting: Internal Medicine

## 2016-08-11 VITALS — BP 129/76 | HR 62 | Temp 98.6°F | Ht 72.0 in | Wt 213.0 lb

## 2016-08-11 DIAGNOSIS — B20 Human immunodeficiency virus [HIV] disease: Secondary | ICD-10-CM

## 2016-08-11 DIAGNOSIS — Z8619 Personal history of other infectious and parasitic diseases: Secondary | ICD-10-CM

## 2016-08-11 DIAGNOSIS — Z113 Encounter for screening for infections with a predominantly sexual mode of transmission: Secondary | ICD-10-CM

## 2016-08-11 LAB — BASIC METABOLIC PANEL
BUN: 14 mg/dL (ref 7–25)
CALCIUM: 9.4 mg/dL (ref 8.6–10.3)
CO2: 26 mmol/L (ref 20–31)
Chloride: 105 mmol/L (ref 98–110)
Creat: 1.2 mg/dL (ref 0.60–1.35)
Glucose, Bld: 82 mg/dL (ref 65–99)
Potassium: 4.3 mmol/L (ref 3.5–5.3)
SODIUM: 138 mmol/L (ref 135–146)

## 2016-08-11 MED ORDER — MENINGOCOCCAL A C Y&W-135 OLIG IM SOLR
0.5000 mL | Freq: Once | INTRAMUSCULAR | Status: AC
  Administered 2016-08-11: 0.5 mL via INTRAMUSCULAR

## 2016-08-11 NOTE — Progress Notes (Signed)
RFV: follow up for hiv disease  Patient ID: Sean West, male   DOB: 08/07/1991, 25 y.o.   MRN: [id number]  HPI 25yo M with HIV disease,CD 4 count of 400/VL<20 (sep 2017) on stribild  Has been working at the [employer] as a Investment banker, operationalchef. Working many hours  Has a roommate, friendship at moment, not sexually active since last visit  No difficulty with his adherence. No recent illnesses Outpatient Encounter Prescriptions as of 08/11/2016  Medication Sig  . elvitegravir-cobicistat-emtricitabine-tenofovir (STRIBILD) 150-150-200-300 MG TABS tablet Take 1 tablet by mouth daily with breakfast.  . [DISCONTINUED] clotrimazole (LOTRIMIN) 1 % cream Apply 1 application topically 2 (two) times daily.  . [DISCONTINUED] promethazine (PHENERGAN) 25 MG tablet Take 1 tablet (25 mg total) by mouth every 6 (six) hours as needed for nausea or vomiting. (Patient not taking: Reported on 05/07/2016)  . [DISCONTINUED] terbinafine (LAMISIL) 250 MG tablet Take 1 tablet (250 mg total) by mouth daily.   No facility-administered encounter medications on file as of 08/11/2016.      Patient Active Problem List   Diagnosis Date Noted  . HIV disease (HCC) 11/12/2014  . Acute hepatitis B 11/12/2014  . History of gonorrhea 11/12/2014  . Abnormal liver function tests 10/22/2014     Health Maintenance Due  Topic Date Due  . TETANUS/TDAP  09/09/2011     Review of Systems 1o point ros is negative Physical Exam   BP 129/76   Pulse 62   Temp 98.6 F (37 C) (Oral)   Ht 6' (1.829 m)   Wt 213 lb (96.6 kg)   BMI 28.89 kg/m  Physical Exam  Constitutional: He is oriented to person, place, and time. He appears well-developed and well-nourished. No distress.  HENT:  Mouth/Throat: Oropharynx is clear and moist. No oropharyngeal exudate.  Cardiovascular: Normal rate, regular rhythm and normal heart sounds. Exam reveals no gallop and no friction rub.  No murmur heard.  Pulmonary/Chest: Effort  normal and breath sounds normal. No respiratory distress. He has no wheezes.  Abdominal: Soft. Bowel sounds are normal. He exhibits no distension. There is no tenderness.  Lymphadenopathy:  He has no cervical adenopathy.  Neurological: He is alert and oriented to person, place, and time.  Skin: Skin is warm and dry. No rash noted. No erythema.  Psychiatric: He has a normal mood and affect. His behavior is normal.    Lab Results  Component Value Date   CD4TCELL 25 (L) 03/10/2016   Lab Results  Component Value Date   CD4TABS 400 03/10/2016   CD4TABS 370 (L) 07/08/2015   CD4TABS TEST REQUEST RECEIVED WITHOUT APPROPRIATE SPECIMEN 07/08/2015   Lab Results  Component Value Date   HIV1RNAQUANT <20 05/07/2016   No results found for: HEPBSAB No results found for: RPR  CBC Lab Results  Component Value Date   WBC 2.6 (L) 05/07/2016   RBC 5.44 05/07/2016   HGB 15.0 05/07/2016   HCT 43.5 05/07/2016   PLT 264 05/07/2016   MCV 80.0 05/07/2016   MCH 27.6 05/07/2016   MCHC 34.5 05/07/2016   RDW 13.4 05/07/2016   LYMPHSABS 962 05/07/2016   MONOABS 494 05/07/2016   EOSABS 104 05/07/2016   BASOSABS 26 05/07/2016   BMET Lab Results  Component Value Date   NA 137 05/07/2016   K 4.4 05/07/2016   CL 103 05/07/2016   CO2 28 05/07/2016   GLUCOSE 88 05/07/2016   BUN 9 05/07/2016   CREATININE 1.33 05/07/2016   CALCIUM  9.5 05/07/2016   GFRNONAA 75 05/07/2016   GFRAA 86 05/07/2016     Assessment and Plan  hiv disease = well controlled, continue on stribild  Syphilis = he was treated in September. 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 uptodate on flu vaccine  rtc in 3 months, labs 2 wk prior

## 2016-08-11 NOTE — Patient Instructions (Signed)
We will see you back in 3 months, come and get labs 2 wk prior to visit

## 2016-08-12 LAB — RPR: RPR: REACTIVE — AB

## 2016-08-12 LAB — RPR TITER

## 2016-08-13 LAB — FLUORESCENT TREPONEMAL AB(FTA)-IGG-BLD: Fluorescent Treponemal ABS: REACTIVE — AB

## 2016-09-07 ENCOUNTER — Other Ambulatory Visit: Payer: Self-pay | Admitting: Internal Medicine

## 2016-09-07 DIAGNOSIS — B20 Human immunodeficiency virus [HIV] disease: Secondary | ICD-10-CM

## 2016-11-17 ENCOUNTER — Ambulatory Visit (INDEPENDENT_AMBULATORY_CARE_PROVIDER_SITE_OTHER): Payer: No Typology Code available for payment source | Admitting: Internal Medicine

## 2016-11-17 ENCOUNTER — Other Ambulatory Visit (HOSPITAL_COMMUNITY)
Admission: RE | Admit: 2016-11-17 | Discharge: 2016-11-17 | Disposition: A | Discharge status: Home / Self Care | Payer: No Typology Code available for payment source | Source: Ambulatory Visit | Attending: Internal Medicine | Admitting: Internal Medicine

## 2016-11-17 ENCOUNTER — Encounter: Payer: Self-pay | Admitting: Internal Medicine

## 2016-11-17 VITALS — BP 155/79 | HR 69 | Temp 98.5°F | Wt 221.0 lb

## 2016-11-17 DIAGNOSIS — Z8619 Personal history of other infectious and parasitic diseases: Secondary | ICD-10-CM | POA: Insufficient documentation

## 2016-11-17 DIAGNOSIS — Z23 Encounter for immunization: Secondary | ICD-10-CM | POA: Diagnosis not present

## 2016-11-17 DIAGNOSIS — Z79899 Other long term (current) drug therapy: Secondary | ICD-10-CM | POA: Diagnosis not present

## 2016-11-17 DIAGNOSIS — Z113 Encounter for screening for infections with a predominantly sexual mode of transmission: Secondary | ICD-10-CM | POA: Insufficient documentation

## 2016-11-17 DIAGNOSIS — B20 Human immunodeficiency virus [HIV] disease: Secondary | ICD-10-CM | POA: Diagnosis present

## 2016-11-17 LAB — CBC WITH DIFFERENTIAL/PLATELET
BASOS PCT: 1 %
Basophils Absolute: 31 cells/uL (ref 0–200)
EOS ABS: 31 {cells}/uL (ref 15–500)
Eosinophils Relative: 1 %
HEMATOCRIT: 44.5 % (ref 38.5–50.0)
HEMOGLOBIN: 15.2 g/dL (ref 13.2–17.1)
LYMPHS ABS: 1519 {cells}/uL (ref 850–3900)
LYMPHS PCT: 49 %
MCH: 28.4 pg (ref 27.0–33.0)
MCHC: 34.2 g/dL (ref 32.0–36.0)
MCV: 83.2 fL (ref 80.0–100.0)
MONO ABS: 372 {cells}/uL (ref 200–950)
MPV: 10.5 fL (ref 7.5–12.5)
Monocytes Relative: 12 %
Neutro Abs: 1147 cells/uL — ABNORMAL LOW (ref 1500–7800)
Neutrophils Relative %: 37 %
Platelets: 211 10*3/uL (ref 140–400)
RBC: 5.35 MIL/uL (ref 4.20–5.80)
RDW: 13.8 % (ref 11.0–15.0)
WBC: 3.1 10*3/uL — AB (ref 3.8–10.8)

## 2016-11-17 LAB — COMPLETE METABOLIC PANEL WITH GFR
ALBUMIN: 4.3 g/dL (ref 3.6–5.1)
ALK PHOS: 45 U/L (ref 40–115)
ALT: 14 U/L (ref 9–46)
AST: 28 U/L (ref 10–40)
BILIRUBIN TOTAL: 1.2 mg/dL (ref 0.2–1.2)
BUN: 17 mg/dL (ref 7–25)
CALCIUM: 9.6 mg/dL (ref 8.6–10.3)
CO2: 25 mmol/L (ref 20–31)
CREATININE: 1.25 mg/dL (ref 0.60–1.35)
Chloride: 101 mmol/L (ref 98–110)
GFR, Est Non African American: 80 mL/min (ref >=60)
Glucose, Bld: 85 mg/dL (ref 65–99)
Potassium: 4.2 mmol/L (ref 3.5–5.3)
Sodium: 135 mmol/L (ref 135–146)
TOTAL PROTEIN: 7.4 g/dL (ref 6.1–8.1)

## 2016-11-17 LAB — LIPID PANEL
CHOLESTEROL: 203 mg/dL — AB (ref <200)
HDL: 42 mg/dL (ref >40)
LDL CALC: 142 mg/dL — AB (ref <100)
TRIGLYCERIDES: 93 mg/dL (ref <150)
Total CHOL/HDL Ratio: 4.8 Ratio (ref <5.0)
VLDL: 19 mg/dL (ref <30)

## 2016-11-17 NOTE — Progress Notes (Signed)
RFV: hiv disease follow up  Patient ID: Sean West, male   DOB: [birth year], 26 y.o.   MRN: [id number]  HPI  Sean West is a 26yo M with hiv disease, hx of syphilis.,  He Has new partner, who is hiv +, truck driver, virologically controlled. He has had unprotected oral sex thus far.  Doing well  Working an additional PT job, now has roughly 80hr work week   Outpatient Encounter Prescriptions as of 11/17/2016  Medication Sig  . STRIBILD 150-150-200-300 MG TABS tablet TAKE 1 TABLET BY MOUTH DAILY WITH BREAKFAST   No facility-administered encounter medications on file as of 11/17/2016.      Patient Active Problem List   Diagnosis Date Noted  . HIV disease (HCC) 11/12/2014  . Acute hepatitis B 11/12/2014  . History of gonorrhea 11/12/2014  . Abnormal liver function tests 10/22/2014     Health Maintenance Due  Topic Date Due  . TETANUS/TDAP  09/09/2011     Review of Systems Review of Systems  Constitutional: Negative for fever, chills, diaphoresis, activity change, appetite change, fatigue and unexpected weight change.  HENT: Negative for congestion, sore throat, rhinorrhea, sneezing, trouble swallowing and sinus pressure.  Eyes: Negative for photophobia and visual disturbance.  Respiratory: Negative for cough, chest tightness, shortness of breath, wheezing and stridor.  Cardiovascular: Negative for chest pain, palpitations and leg swelling.  Gastrointestinal: Negative for nausea, vomiting, abdominal pain, diarrhea, constipation, blood in stool, abdominal distention and anal bleeding.  Genitourinary: Negative for dysuria, hematuria, flank pain and difficulty urinating.  Musculoskeletal: Negative for myalgias, back pain, joint swelling, arthralgias and gait problem.  Skin: Negative for color change, pallor, rash and wound.  Neurological: Negative for dizziness, tremors, weakness and light-headedness.  Hematological: Negative for adenopathy. Does not bruise/bleed  easily.  Psychiatric/Behavioral: Negative for behavioral problems, confusion, sleep disturbance, dysphoric mood, decreased concentration and agitation.   Social History  Substance Use Topics  . Smoking status: Never Smoker  . Smokeless tobacco: Never Used  . Alcohol use 0.6 oz/week    1 Glasses of wine per week     Comment: ocassional   Physical Exam   BP (!) 155/79   Pulse 69   Temp 98.5 F (36.9 C) (Oral)   Wt 221 lb (100.2 kg)   SpO2 98%   BMI 29.97 kg/m   Physical Exam  Constitutional: He is oriented to person, place, and time. He appears well-developed and well-nourished. No distress.  HENT:  Mouth/Throat: Oropharynx is clear and moist. No oropharyngeal exudate.  Cardiovascular: Normal rate, regular rhythm and normal heart sounds. Exam reveals no gallop and no friction rub.  No murmur heard.  Pulmonary/Chest: Effort normal and breath sounds normal. No respiratory distress. He has no wheezes.  Lymphadenopathy:  He has no cervical adenopathy.  Neurological: He is alert and oriented to person, place, and time.  Skin: Skin is warm and dry. No rash noted. No erythema.  Psychiatric: He has a normal mood and affect. His behavior is normal.    Lab Results  Component Value Date   CD4TCELL 25 (L) 03/10/2016   Lab Results  Component Value Date   CD4TABS 400 03/10/2016   CD4TABS 370 (L) 07/08/2015   CD4TABS TEST REQUEST RECEIVED WITHOUT APPROPRIATE SPECIMEN 07/08/2015   Lab Results  Component Value Date   HIV1RNAQUANT <20 05/07/2016   No results found for: HEPBSAB Lab Results  Component Value Date   LABRPR REACTIVE (A) 08/11/2016    CBC Lab Results  Component Value Date  WBC 2.6 (L) 05/07/2016   RBC 5.44 05/07/2016   HGB 15.0 05/07/2016   HCT 43.5 05/07/2016   PLT 264 05/07/2016   MCV 80.0 05/07/2016   MCH 27.6 05/07/2016   MCHC 34.5 05/07/2016   RDW 13.4 05/07/2016   LYMPHSABS 962 05/07/2016   MONOABS 494 05/07/2016   EOSABS 104 05/07/2016     BMET Lab Results  Component Value Date   NA 138 08/11/2016   K 4.3 08/11/2016   CL 105 08/11/2016   CO2 26 08/11/2016   GLUCOSE 82 08/11/2016   BUN 14 08/11/2016   CREATININE 1.20 08/11/2016   CALCIUM 9.4 08/11/2016   GFRNONAA 75 05/07/2016   GFRAA 86 05/07/2016      Assessment and Plan hiv disease = will check labs. Doing well with meds. Continue on current regimen  Hx of syphilis = will check rpr to see that he is responded to tx in the fall  Health maintenance =will give hpv #3  High risk sex = will check gc chlam of oralpharynx nad do urine cytology

## 2016-11-18 LAB — T-HELPER CELL (CD4) - (RCID CLINIC ONLY)
CD4 % Helper T Cell: 24 % — ABNORMAL LOW (ref 33–55)
CD4 T Cell Abs: 370 /uL — ABNORMAL LOW (ref 400–2700)

## 2016-11-18 LAB — RPR

## 2016-11-19 LAB — CYTOLOGY, (ORAL, ANAL, URETHRAL) ANCILLARY ONLY
Chlamydia: NEGATIVE
Neisseria Gonorrhea: POSITIVE — AB

## 2016-11-19 LAB — HIV-1 RNA QUANT-NO REFLEX-BLD
HIV 1 RNA QUANT: NOT DETECTED {copies}/mL
HIV-1 RNA QUANT, LOG: NOT DETECTED {Log_copies}/mL

## 2016-11-19 LAB — URINE CYTOLOGY ANCILLARY ONLY
Chlamydia: NEGATIVE
NEISSERIA GONORRHEA: NEGATIVE

## 2016-11-24 ENCOUNTER — Telehealth: Payer: Self-pay | Admitting: *Deleted

## 2016-11-24 NOTE — Telephone Encounter (Signed)
-----   Message from Judyann Munson, MD sent at 11/19/2016  4:59 PM EDT ----- He has GC of mouth. Needs ceftriaxone  X 1 dose plus oral azithromycin 1gm   Also have his boyfriend, to also get treated at local HD

## 2016-11-24 NOTE — Telephone Encounter (Signed)
Per Dr Drue Second called the patient to advise of +lab results and need for treatment. Patient coming 11/27/16 at 9.

## 2016-11-25 ENCOUNTER — Ambulatory Visit (INDEPENDENT_AMBULATORY_CARE_PROVIDER_SITE_OTHER): Payer: No Typology Code available for payment source | Admitting: *Deleted

## 2016-11-25 DIAGNOSIS — A64 Unspecified sexually transmitted disease: Secondary | ICD-10-CM

## 2016-11-25 MED ORDER — AZITHROMYCIN 250 MG PO TABS: 250.0000 mg | ORAL_TABLET | Freq: Once | ORAL | Status: DC

## 2016-11-25 MED ORDER — AZITHROMYCIN 250 MG PO TABS
1000.0000 mg | ORAL_TABLET | Freq: Once | ORAL | Status: AC
  Administered 2016-11-25: 1000 mg via ORAL

## 2016-11-25 MED ORDER — CEFTRIAXONE SODIUM 250 MG IJ SOLR
250.0000 mg | Freq: Once | INTRAMUSCULAR | Status: AC
  Administered 2016-11-25: 250 mg via INTRAMUSCULAR

## 2016-11-27 ENCOUNTER — Ambulatory Visit: Payer: No Typology Code available for payment source

## 2017-01-20 ENCOUNTER — Other Ambulatory Visit: Payer: No Typology Code available for payment source

## 2017-01-20 DIAGNOSIS — B20 Human immunodeficiency virus [HIV] disease: Secondary | ICD-10-CM

## 2017-01-21 LAB — T-HELPER CELL (CD4) - (RCID CLINIC ONLY)
CD4 % Helper T Cell: 28 % — ABNORMAL LOW (ref 33–55)
CD4 T CELL ABS: 350 /uL — AB (ref 400–2700)

## 2017-01-23 LAB — HIV-1 RNA QUANT-NO REFLEX-BLD
HIV 1 RNA QUANT: NOT DETECTED {copies}/mL
HIV-1 RNA QUANT, LOG: NOT DETECTED {Log_copies}/mL

## 2017-02-02 ENCOUNTER — Ambulatory Visit: Payer: No Typology Code available for payment source | Admitting: Internal Medicine

## 2017-02-23 ENCOUNTER — Ambulatory Visit: Payer: No Typology Code available for payment source | Admitting: Internal Medicine

## 2017-07-27 ENCOUNTER — Ambulatory Visit: Payer: No Typology Code available for payment source | Admitting: Internal Medicine

## 2017-09-14 ENCOUNTER — Ambulatory Visit: Payer: 59 | Admitting: Internal Medicine

## 2018-05-10 ENCOUNTER — Ambulatory Visit: Payer: 59

## 2018-05-27 ENCOUNTER — Other Ambulatory Visit: Payer: 59

## 2018-05-27 ENCOUNTER — Other Ambulatory Visit: Payer: Self-pay | Admitting: *Deleted

## 2018-05-27 DIAGNOSIS — B20 Human immunodeficiency virus [HIV] disease: Secondary | ICD-10-CM

## 2018-05-27 LAB — T-HELPER CELL (CD4) - (RCID CLINIC ONLY)
CD4 % Helper T Cell: 14 % — ABNORMAL LOW (ref 33–55)
CD4 T Cell Abs: 300 /uL — ABNORMAL LOW (ref 400–2700)

## 2018-05-30 ENCOUNTER — Ambulatory Visit: Payer: 59 | Admitting: Family

## 2018-05-30 LAB — COMPLETE METABOLIC PANEL WITH GFR
AG Ratio: 0.8 (calc) — ABNORMAL LOW (ref 1.0–2.5)
ALBUMIN MSPROF: 4 g/dL (ref 3.6–5.1)
ALKALINE PHOSPHATASE (APISO): 58 U/L (ref 40–115)
ALT: 19 U/L (ref 9–46)
AST: 25 U/L (ref 10–40)
BUN: 10 mg/dL (ref 7–25)
CO2: 25 mmol/L (ref 20–32)
CREATININE: 1.32 mg/dL (ref 0.60–1.35)
Calcium: 9.3 mg/dL (ref 8.6–10.3)
Chloride: 104 mmol/L (ref 98–110)
GFR, EST NON AFRICAN AMERICAN: 74 mL/min/{1.73_m2} (ref >=60)
GFR, Est African American: 86 mL/min/{1.73_m2} (ref >=60)
Globulin: 5 g/dL (calc) — ABNORMAL HIGH (ref 1.9–3.7)
Glucose, Bld: 96 mg/dL (ref 65–99)
Potassium: 3.8 mmol/L (ref 3.5–5.3)
SODIUM: 136 mmol/L (ref 135–146)
TOTAL PROTEIN: 9 g/dL — AB (ref 6.1–8.1)
Total Bilirubin: 0.9 mg/dL (ref 0.2–1.2)

## 2018-05-30 LAB — CBC WITH DIFFERENTIAL/PLATELET
Basophils Absolute: 32 cells/uL (ref 0–200)
Basophils Relative: 0.6 %
EOS ABS: 58 {cells}/uL (ref 15–500)
Eosinophils Relative: 1.1 %
HCT: 43.2 % (ref 38.5–50.0)
Hemoglobin: 14.8 g/dL (ref 13.2–17.1)
Lymphs Abs: 2173 cells/uL (ref 850–3900)
MCH: 27.9 pg (ref 27.0–33.0)
MCHC: 34.3 g/dL (ref 32.0–36.0)
MCV: 81.4 fL (ref 80.0–100.0)
MPV: 10.7 fL (ref 7.5–12.5)
Monocytes Relative: 14.2 %
NEUTROS PCT: 43.1 %
Neutro Abs: 2284 cells/uL (ref 1500–7800)
PLATELETS: 261 10*3/uL (ref 140–400)
RBC: 5.31 10*6/uL (ref 4.20–5.80)
RDW: 12.4 % (ref 11.0–15.0)
TOTAL LYMPHOCYTE: 41 %
WBC: 5.3 10*3/uL (ref 3.8–10.8)
WBCMIX: 753 {cells}/uL (ref 200–950)

## 2018-05-30 LAB — HIV-1 RNA QUANT-NO REFLEX-BLD
HIV 1 RNA QUANT: 30500 {copies}/mL — AB
HIV-1 RNA Quant, Log: 4.48 Log copies/mL — ABNORMAL HIGH

## 2018-06-07 ENCOUNTER — Encounter: Payer: Self-pay | Admitting: Internal Medicine

## 2018-06-07 ENCOUNTER — Ambulatory Visit (INDEPENDENT_AMBULATORY_CARE_PROVIDER_SITE_OTHER): Payer: 59 | Admitting: Internal Medicine

## 2018-06-07 ENCOUNTER — Other Ambulatory Visit (HOSPITAL_COMMUNITY)
Admission: RE | Admit: 2018-06-07 | Discharge: 2018-06-07 | Disposition: A | Discharge status: Home / Self Care | Payer: 59 | Source: Ambulatory Visit | Attending: Internal Medicine | Admitting: Internal Medicine

## 2018-06-07 VITALS — BP 130/83 | HR 77 | Temp 98.8°F | Ht 72.0 in | Wt 214.0 lb

## 2018-06-07 DIAGNOSIS — Z23 Encounter for immunization: Secondary | ICD-10-CM | POA: Diagnosis not present

## 2018-06-07 DIAGNOSIS — B20 Human immunodeficiency virus [HIV] disease: Secondary | ICD-10-CM

## 2018-06-07 DIAGNOSIS — A64 Unspecified sexually transmitted disease: Secondary | ICD-10-CM | POA: Diagnosis present

## 2018-06-07 MED ORDER — PENICILLIN G BENZATHINE 1200000 UNIT/2ML IM SUSP
1.2000 10*6.[IU] | Freq: Once | INTRAMUSCULAR | Status: AC
  Administered 2018-06-07: 1.2 10*6.[IU] via INTRAMUSCULAR

## 2018-06-07 MED ORDER — IMIQUIMOD 5 % EX CREA: TOPICAL_CREAM | CUTANEOUS | 0 refills | Status: DC

## 2018-06-07 MED ORDER — DARUN-COBIC-EMTRICIT-TENOFAF 800-150-200-10 MG PO TABS: 1.0000 | ORAL_TABLET | Freq: Every day | ORAL | 11 refills | Status: DC

## 2018-06-07 NOTE — Progress Notes (Signed)
    Patient ID: Sean West, male   DOB: 11-20-1990, 27 y.o.   MRN: [id number]  HPI 27yo M , off of art, was spacing out his ART. Has been here and [place], after his mother's passing of ESRD.  3 wk of rash to penis and anus. Having unprotected sex. Off of meds x 2 months  Outpatient Encounter Medications as of 06/07/2018  Medication Sig  . STRIBILD 150-150-200-300 MG TABS tablet TAKE 1 TABLET BY MOUTH DAILY WITH BREAKFAST   No facility-administered encounter medications on file as of 06/07/2018.      Patient Active Problem List   Diagnosis Date Noted  . HIV disease (HCC) 11/12/2014  . Acute hepatitis B 11/12/2014  . History of gonorrhea 11/12/2014  . Abnormal liver function tests 10/22/2014     Health Maintenance Due  Topic Date Due  . TETANUS/TDAP  09/09/2011  . INFLUENZA VACCINE  03/24/2018     Review of Systems  Physical Exam   BP 130/83   Pulse 77   Temp 98.8 F (37.1 C)   Ht 6' (1.829 m)   Wt 214 lb (97.1 kg)   BMI 29.02 kg/m    gu = looks like chancre on penis. On buttocks, ? Wart vs. HSV lesion Lab Results  Component Value Date   CD4TCELL 14 (L) 05/27/2018   Lab Results  Component Value Date   CD4TABS 300 (L) 05/27/2018   CD4TABS 350 (L) 01/20/2017   CD4TABS 370 (L) 11/17/2016   Lab Results  Component Value Date   HIV1RNAQUANT 30,500 (H) 05/27/2018   No results found for: HEPBSAB Lab Results  Component Value Date   LABRPR NON REAC 11/17/2016    CBC Lab Results  Component Value Date   WBC 5.3 05/27/2018   RBC 5.31 05/27/2018   HGB 14.8 05/27/2018   HCT 43.2 05/27/2018   PLT 261 05/27/2018   MCV 81.4 05/27/2018   MCH 27.9 05/27/2018   MCHC 34.3 05/27/2018   RDW 12.4 05/27/2018   LYMPHSABS 2,173 05/27/2018   MONOABS 372 11/17/2016   EOSABS 58 05/27/2018    BMET Lab Results  Component Value Date   NA 136 05/27/2018   K 3.8 05/27/2018   CL 104 05/27/2018   CO2 25 05/27/2018   GLUCOSE 96 05/27/2018   BUN 10 05/27/2018    CREATININE 1.32 05/27/2018   CALCIUM 9.3 05/27/2018   GFRNONAA 74 05/27/2018   GFRAA 86 05/27/2018      Assessment and Plan  - will do 7 d of acyclovir to see if any improvement in lesions. If not, instructed to use imoquid Unprotected sex- will check gc/chlam and RPR. Urine cytology. Will empirically treat for penicillin hiv = will add genotype - will give symtuza plus tivicay in hand. And await genotype to see if can narrow to symtuza - will send in rx for symtuza  Addendum = RPR positive, will treat for syphilis. Still reach out that he needs to contact sexual partners to be teseted

## 2018-06-07 NOTE — Progress Notes (Signed)
Patient c/o rash around penile and anal area, no itching or other symptoms associated with rash. Patient state rash started about 3 weeks ago. Patient states he does not always have protected sex.    Patient received Flu vaccination today. Tolerated well.   Patient receivedX1 bicillin injection  Per Dr. Drue Second today  for STI treatment. Patient encouraged to notify any recent partners to also be treated at local health [facility]. Patient accepted condoms when offered. Advised always to practice safe sexual activities. Patient remained in office for 10 minutes to observe for any adverse reactions. No reactions noted. Patient tolerated well.  Amahri Dengel S. LPN

## 2018-06-09 LAB — URINE CYTOLOGY ANCILLARY ONLY
CHLAMYDIA, DNA PROBE: NEGATIVE
NEISSERIA GONORRHEA: NEGATIVE

## 2018-06-09 LAB — CYTOLOGY, (ORAL, ANAL, URETHRAL) ANCILLARY ONLY
CHLAMYDIA, DNA PROBE: NEGATIVE
Chlamydia: NEGATIVE
NEISSERIA GONORRHEA: NEGATIVE
Neisseria Gonorrhea: NEGATIVE

## 2018-06-10 ENCOUNTER — Other Ambulatory Visit: Payer: Self-pay | Admitting: *Deleted

## 2018-06-10 ENCOUNTER — Telehealth: Payer: Self-pay

## 2018-06-10 DIAGNOSIS — B20 Human immunodeficiency virus [HIV] disease: Secondary | ICD-10-CM

## 2018-06-10 MED ORDER — DARUN-COBIC-EMTRICIT-TENOFAF 800-150-200-10 MG PO TABS: 1.0000 | ORAL_TABLET | Freq: Every day | ORAL | 5 refills | Status: DC

## 2018-06-10 NOTE — Telephone Encounter (Signed)
Attempted PA for Symtuza over phone at [phone]. PA representative stated she is unable to run claim due to information not being the same.   Tried to call patient to confirm information was unable to reach patient at this time. Left voicemail asking patient to call back to confirm information with our office. Lorenso Courier, [place]

## 2018-06-14 ENCOUNTER — Encounter: Payer: Self-pay | Admitting: *Deleted

## 2018-06-14 ENCOUNTER — Telehealth: Payer: Self-pay | Admitting: *Deleted

## 2018-06-14 MED ORDER — BICTEGRAVIR-EMTRICITAB-TENOFOV 50-200-25 MG PO TABS: 1.0000 | ORAL_TABLET | Freq: Every day | ORAL | 5 refills | Status: DC

## 2018-06-14 NOTE — Telephone Encounter (Signed)
biktarvy will work.

## 2018-06-14 NOTE — Addendum Note (Signed)
Addended by: Lurlean Leyden on: 06/14/2018 03:10 PM   Modules accepted: Orders

## 2018-06-14 NOTE — Telephone Encounter (Signed)
Patient insurance will not cover the Symtuza. Advised it is a formulary exclusion and they will not even allow a PA. Advised to change the medication to something else as they cover  Odefsey, descovy, atripla, and Biktarvy.  Please advise which medication to place him on.

## 2018-06-16 LAB — RPR: RPR: REACTIVE — AB

## 2018-06-16 LAB — HIV RNA, RTPCR W/R GT (RTI, PI,INT)
HIV 1 RNA Quant: 28200 copies/mL — ABNORMAL HIGH
HIV-1 RNA Quant, Log: 4.45 Log copies/mL — ABNORMAL HIGH

## 2018-06-16 LAB — COMPLETE METABOLIC PANEL WITH GFR
AG Ratio: 0.8 (calc) — ABNORMAL LOW (ref 1.0–2.5)
ALT: 27 U/L (ref 9–46)
AST: 48 U/L — AB (ref 10–40)
Albumin: 4 g/dL (ref 3.6–5.1)
Alkaline phosphatase (APISO): 48 U/L (ref 40–115)
BUN: 8 mg/dL (ref 7–25)
CALCIUM: 9.3 mg/dL (ref 8.6–10.3)
CO2: 28 mmol/L (ref 20–32)
CREATININE: 1.15 mg/dL (ref 0.60–1.35)
Chloride: 105 mmol/L (ref 98–110)
GFR, EST NON AFRICAN AMERICAN: 88 mL/min/{1.73_m2} (ref >=60)
GFR, Est African American: 102 mL/min/{1.73_m2} (ref >=60)
Globulin: 4.9 g/dL (calc) — ABNORMAL HIGH (ref 1.9–3.7)
Glucose, Bld: 85 mg/dL (ref 65–99)
Potassium: 4.4 mmol/L (ref 3.5–5.3)
Sodium: 137 mmol/L (ref 135–146)
Total Bilirubin: 0.8 mg/dL (ref 0.2–1.2)
Total Protein: 8.9 g/dL — ABNORMAL HIGH (ref 6.1–8.1)

## 2018-06-16 LAB — RPR TITER: RPR Titer: 1:32 {titer} — ABNORMAL HIGH

## 2018-06-16 LAB — FLUORESCENT TREPONEMAL AB(FTA)-IGG-BLD: FLUORESCENT TREPONEMAL ABS: REACTIVE — AB

## 2018-06-16 LAB — HIV-1 GENOTYPE: HIV-1 GENOTYPE: DETECTED — AB

## 2018-06-16 LAB — HIV-1 INTEGRASE GENOTYPE

## 2018-07-18 ENCOUNTER — Ambulatory Visit: Payer: 59 | Admitting: Family

## 2018-07-18 NOTE — Progress Notes (Deleted)
   Subjective:    Patient ID: Sean West, male    DOB: [birth year], 27 y.o.   MRN: [id number]  No chief complaint on file.    HPI:  Sean West is a 27 y.o. male who presents today for routine follow-up of HIV disease and hepatitis B.  Mr. Debbora DusMiller-Jones was last seen in the office on 06/07/2018 and off antiretroviral therapy for approximately 2 months..  During this visit he was noted to have a rash on his penis and anus.  He was treated with one course of Bicillin, started on a 7-day of acyclovir and antiretroviral therapy with Tivicay and Symtuza. Gentotype was detected with no resistance present and demonstrated wild type virus. On 10/22 he was then started on Biktarvy. No other genotypes were available for review. His viral load at the time was 28,200 with a CD4 count of 300. RPR was positive with a a titer of 1:32 which was previously non-reactive in March of 2018.      Allergies  Allergen Reactions  . Zyrtec Allergy [Cetirizine Hcl] Swelling      Outpatient Medications Prior to Visit  Medication Sig Dispense Refill  . bictegravir-emtricitabine-tenofovir AF (BIKTARVY) 50-200-25 MG TABS tablet Take 1 tablet by mouth daily. 30 tablet 5  . imiquimod (ALDARA) 5 % cream Apply topically 3 (three) times a week. Apply at bedtime to affected areas on buttocks. Wash off in the morning. Do not exceed 10hrs on skin 12 each 0   No facility-administered medications prior to visit.      Past Medical History:  Diagnosis Date  . Hepatitis      Past Surgical History:  Procedure Laterality Date  . APPENDECTOMY         Review of Systems    Objective:    There were no vitals taken for this visit. Nursing note and vital signs reviewed.  Physical Exam     Assessment & Plan:   Problem List Items Addressed This Visit    None       I am having Altair West maintain his imiquimod and bictegravir-emtricitabine-tenofovir AF.   No orders  of the defined types were placed in this encounter.    Follow-up: No follow-ups on file.   Marcos EkeGreg Calone, MSN, FNP-C Nurse Practitioner [facility] Office phone: [phone] Pager: [phone] [facility] Main number: [phone]

## 2018-08-15 ENCOUNTER — Ambulatory Visit (INDEPENDENT_AMBULATORY_CARE_PROVIDER_SITE_OTHER): Payer: 59 | Admitting: Internal Medicine

## 2018-08-15 ENCOUNTER — Other Ambulatory Visit (HOSPITAL_COMMUNITY)
Admission: RE | Admit: 2018-08-15 | Discharge: 2018-08-15 | Disposition: A | Discharge status: Home / Self Care | Payer: 59 | Source: Ambulatory Visit | Attending: Internal Medicine | Admitting: Internal Medicine

## 2018-08-15 ENCOUNTER — Encounter: Payer: Self-pay | Admitting: Internal Medicine

## 2018-08-15 VITALS — BP 152/81 | HR 62 | Temp 98.8°F | Wt 217.0 lb

## 2018-08-15 DIAGNOSIS — B20 Human immunodeficiency virus [HIV] disease: Secondary | ICD-10-CM

## 2018-08-15 DIAGNOSIS — Z202 Contact with and (suspected) exposure to infections with a predominantly sexual mode of transmission: Secondary | ICD-10-CM | POA: Diagnosis not present

## 2018-08-15 DIAGNOSIS — A64 Unspecified sexually transmitted disease: Secondary | ICD-10-CM

## 2018-08-15 DIAGNOSIS — Z8619 Personal history of other infectious and parasitic diseases: Secondary | ICD-10-CM

## 2018-08-15 DIAGNOSIS — Z79899 Other long term (current) drug therapy: Secondary | ICD-10-CM

## 2018-08-15 MED ORDER — CEFTRIAXONE SODIUM 250 MG IJ SOLR
250.0000 mg | Freq: Once | INTRAMUSCULAR | Status: AC
  Administered 2018-08-15: 250 mg via INTRAMUSCULAR

## 2018-08-15 MED ORDER — AZITHROMYCIN 250 MG PO TABS
1000.0000 mg | ORAL_TABLET | Freq: Once | ORAL | Status: AC
  Administered 2018-08-15: 1000 mg via ORAL

## 2018-08-15 NOTE — Progress Notes (Signed)
RFV: follow up for hiv disease  Patient ID: Sean West, male   DOB: 20-May-1991, [age] y.o.   MRN: [id number]  HPI CD 4 count of 300/VL 28,300 in Oct 2019. On biktarvy.doing well with his own medication. Has been with a new partner since our last visit and he reports that His partner has symptoms with penile drainage and they have had unprotected sex. Patient is without symptoms at this time.  Outpatient Encounter Medications as of 08/15/2018  Medication Sig  . bictegravir-emtricitabine-tenofovir AF (BIKTARVY) 50-200-25 MG TABS tablet Take 1 tablet by mouth daily.  . imiquimod (ALDARA) 5 % cream Apply topically 3 (three) times a week. Apply at bedtime to affected areas on buttocks. Wash off in the morning. Do not exceed 10hrs on skin   No facility-administered encounter medications on file as of 08/15/2018.      Patient Active Problem List   Diagnosis Date Noted  . HIV disease (HCC) 11/12/2014  . Acute hepatitis B 11/12/2014  . History of gonorrhea 11/12/2014  . Abnormal liver function tests 10/22/2014     Health Maintenance Due  Topic Date Due  . Janet BerlinETANUS/TDAP  09/09/2011    Social History   Tobacco Use  . Smoking status: Never Smoker  . Smokeless tobacco: Never Used  Substance Use Topics  . Alcohol use: Yes    Alcohol/week: 1.0 standard drinks    Types: 1 Glasses of wine per week    Comment: ocassional  . Drug use: No   Review of Systems Review of Systems  Constitutional: Negative for fever, chills, diaphoresis, activity change, appetite change, fatigue and unexpected weight change.  HENT: Negative for congestion, sore throat, rhinorrhea, sneezing, trouble swallowing and sinus pressure.  Eyes: Negative for photophobia and visual disturbance.  Respiratory: Negative for cough, chest tightness, shortness of breath, wheezing and stridor.  Cardiovascular: Negative for chest pain, palpitations and leg swelling.  Gastrointestinal: Negative for nausea, vomiting,  abdominal pain, diarrhea, constipation, blood in stool, abdominal distention and anal bleeding.  Genitourinary: Negative for dysuria, hematuria, flank pain and difficulty urinating.  Musculoskeletal: Negative for myalgias, back pain, joint swelling, arthralgias and gait problem.  Skin: Negative for color change, pallor, rash and wound.  Neurological: Negative for dizziness, tremors, weakness and light-headedness.  Hematological: Negative for adenopathy. Does not bruise/bleed easily.  Psychiatric/Behavioral: Negative for behavioral problems, confusion, sleep disturbance, dysphoric mood, decreased concentration and agitation.    Physical Exam   BP (!) 152/81   Pulse 62   Temp 98.8 F (37.1 C) (Oral)   Wt 217 lb (98.4 kg)   BMI 29.43 kg/m   Physical Exam  Constitutional: He is oriented to person, place, and time. He appears well-developed and well-nourished. No distress.  HENT:  Mouth/Throat: Oropharynx is clear and moist. No oropharyngeal exudate.  Cardiovascular: Normal rate, regular rhythm and normal heart sounds. Exam reveals no gallop and no friction rub.  No murmur heard.  Pulmonary/Chest: Effort normal and breath sounds normal. No respiratory distress. He has no wheezes.  Abdominal: Soft. Bowel sounds are normal. He exhibits no distension. There is no tenderness.  Lymphadenopathy:  He has no cervical adenopathy.  Neurological: He is alert and oriented to person, place, and time.  Skin: Skin is warm and dry. No rash noted. No erythema.  Psychiatric: He has a normal mood and affect. His behavior is normal.    Lab Results  Component Value Date   CD4TCELL 14 (L) 05/27/2018   Lab Results  Component Value Date   CD4TABS  300 (L) 05/27/2018   CD4TABS 350 (L) 01/20/2017   CD4TABS 370 (L) 11/17/2016   Lab Results  Component Value Date   HIV1RNAQUANT 28,200 (H) 06/07/2018   No results found for: HEPBSAB Lab Results  Component Value Date   LABRPR REACTIVE (A) 06/07/2018     CBC Lab Results  Component Value Date   WBC 5.3 05/27/2018   RBC 5.31 05/27/2018   HGB 14.8 05/27/2018   HCT 43.2 05/27/2018   PLT 261 05/27/2018   MCV 81.4 05/27/2018   MCH 27.9 05/27/2018   MCHC 34.3 05/27/2018   RDW 12.4 05/27/2018   LYMPHSABS 2,173 05/27/2018   MONOABS 372 11/17/2016   EOSABS 58 05/27/2018    BMET Lab Results  Component Value Date   NA 137 06/07/2018   K 4.4 06/07/2018   CL 105 06/07/2018   CO2 28 06/07/2018   GLUCOSE 85 06/07/2018   BUN 8 06/07/2018   CREATININE 1.15 06/07/2018   CALCIUM 9.3 06/07/2018   GFRNONAA 88 06/07/2018   GFRAA 102 06/07/2018      Assessment and Plan  sti exposure =Will treat for sti, given his new partner is symptomatic.   Hx of syphilis = Will check syphilis titer  hiv disease = Will check viral load, since he has been on ART x 5 wks  Long term medication management = cr is stable

## 2018-08-16 LAB — CYTOLOGY, (ORAL, ANAL, URETHRAL) ANCILLARY ONLY
Chlamydia: NEGATIVE
Chlamydia: NEGATIVE
Neisseria Gonorrhea: POSITIVE — AB
Neisseria Gonorrhea: POSITIVE — AB

## 2018-08-20 LAB — RPR TITER: RPR Titer: 1:16 {titer} — ABNORMAL HIGH

## 2018-08-20 LAB — RPR: RPR: REACTIVE — AB

## 2018-08-20 LAB — HIV-1 RNA QUANT-NO REFLEX-BLD
HIV 1 RNA QUANT: 20100 {copies}/mL — AB
HIV-1 RNA QUANT, LOG: 4.3 {Log_copies}/mL — AB

## 2018-08-20 LAB — FLUORESCENT TREPONEMAL AB(FTA)-IGG-BLD: FLUORESCENT TREPONEMAL ABS: REACTIVE — AB

## 2018-10-10 ENCOUNTER — Ambulatory Visit: Payer: Self-pay

## 2018-11-09 ENCOUNTER — Ambulatory Visit: Payer: Self-pay

## 2018-12-06 ENCOUNTER — Telehealth: Payer: Self-pay

## 2018-12-06 NOTE — Telephone Encounter (Signed)
Called patient to attempt scheduling over due appointment. Left message for [employer] to call office back.    Gerarda Fraction, [place]

## 2018-12-08 ENCOUNTER — Encounter: Payer: Self-pay | Admitting: Family

## 2018-12-16 NOTE — Telephone Encounter (Signed)
Called patient left message to call back for appointment   Sean West  Referral Coordinator

## 2019-02-15 ENCOUNTER — Telehealth: Payer: Self-pay | Admitting: Family

## 2019-02-15 DIAGNOSIS — J029 Acute pharyngitis, unspecified: Secondary | ICD-10-CM

## 2019-02-15 MED ORDER — AMOXICILLIN 875 MG PO TABS: 875.0000 mg | ORAL_TABLET | Freq: Two times a day (BID) | ORAL | 0 refills | Status: DC

## 2019-02-15 NOTE — Progress Notes (Signed)

## 2019-06-17 ENCOUNTER — Telehealth: Payer: Self-pay | Admitting: Nurse Practitioner

## 2019-06-17 DIAGNOSIS — J4521 Mild intermittent asthma with (acute) exacerbation: Secondary | ICD-10-CM

## 2019-06-17 MED ORDER — ALBUTEROL SULFATE HFA 108 (90 BASE) MCG/ACT IN AERS: 2.0000 | INHALATION_SPRAY | Freq: Four times a day (QID) | RESPIRATORY_TRACT | 0 refills | Status: DC | PRN

## 2019-06-17 NOTE — Progress Notes (Signed)
E Visit for Asthma  Based on what you have shared with me, it looks like you may have a flare up of your asthma.  Asthma is a chronic (ongoing) lung disease which results in airway obstruction, inflammation and hyper-responsiveness.   Asthma symptoms vary from person to person, with common symptoms including nighttime awakening and decreased ability to participate in normal activities as a result of shortness of breath. It is often triggered by changes in weather, changes in the season, changes in air temperature, or inside (home, school, daycare or work) allergens such as animal dander, mold, mildew, woodstoves or cockroaches.   It can also be triggered by hormonal changes, extreme emotion, physical exertion or an upper respiratory tract illness.     It is important to identify the trigger, and then eliminate or avoid the trigger if possible.   If you have been prescribed medications to be taken on a regular basis, it is important to follow the asthma action plan and to follow guidelines to adjust medication in response to increasing symptoms of decreased peak expiratory flow rate  Treatment: I have prescribed: Albuterol (Proventil HFA; Ventolin HFA) 108 (90 Base) MCG/ACT Inhaler 2 puffs into the lungs every six hours as needed for wheezing or shortness of breath  HOME CARE . Only take medications as instructed by your medical team. . Consider wearing a mask or scarf to improve breathing air temperature have been shown to decrease irritation and decrease exacerbations . Get rest. . Taking a steamy shower or using a humidifier may help nasal congestion sand ease sore throat pain. You can place a towel over your head and breathe in the steam from hot water coming from a faucet. . Using a saline nasal spray works much the same way.  . Cough drops, hare candies and sore throat lozenges may  ease your cough.  . Avoid close contacts especially the very you and the elderly . Cover your mouth if you cough or sneeze . Always remember to wash your hands.    GET HELP RIGHT AWAY IF: . You develop worsening symptoms; breathlessness at rest, drowsy, confused or agitated, unable to speak in full sentences . You have coughing fits . You develop a severe headache or visual changes . You develop shortness of breath, difficulty breathing or start having chest pain . Your symptoms persist after you have completed your treatment plan . If your symptoms do not improve within 10 days  MAKE SURE YOU . Understand these instructions. . Will watch your condition. . Will get help right away if you are not doing well or get worse.   Your e-visit answers were reviewed by a board certified advanced clinical practitioner to complete your personal care plan, Depending upon the condition, your plan could have included both over the counter or prescription medications.  Please review your pharmacy choice. Your safety is important to us. If you have drug allergies check your prescription carefully. You can use MyChart to ask questions about today's visit, request a non-urgent call back, or ask for a work or school excuse for 24 hours related to this e-Visit. If it has been greater than 24 hours you will need to follow up with your provider, or enter a new e-Visit to address those concerns.  You will get an e-mail in the next two days asking about your experience. I hope that your e-visit has been valuable and will speed your recovery. Thank you for using e-visits.  5-10 minutes spent reviewing and   documenting in chart.  

## 2019-06-18 ENCOUNTER — Telehealth: Payer: Self-pay | Admitting: Family

## 2019-06-18 DIAGNOSIS — R079 Chest pain, unspecified: Secondary | ICD-10-CM

## 2019-06-18 DIAGNOSIS — U071 COVID-19: Secondary | ICD-10-CM

## 2019-06-18 DIAGNOSIS — R0602 Shortness of breath: Secondary | ICD-10-CM

## 2019-06-18 NOTE — Progress Notes (Signed)
Based on what you shared with me, I feel your condition warrants further evaluation and I recommend that you be seen for a face to face office visit.  Given the length of your symptoms you need to be seen face to face for a chest x-ray to rule out pneumonia.   NOTE: If you entered your credit card information for this eVisit, you will not be charged. You may see a "hold" on your card for the $35 but that hold will drop off and you will not have a charge processed.  If you are having a true medical emergency please call 911.     For an urgent face to face visit, [place] has four urgent care centers for your convenience:   . [facility]    [phone]                  Get Driving Directions  [address], [place] [address] . 10 am to 8 pm Monday-Friday . 12 pm to 8 pm Saturday-Sunday   . [facility] at [place]                  Get Driving Directions  [address], [place], [place] [address] . 8 am to 8 pm Monday-Friday . 9 am to 6 pm Saturday . 11 am to 6 pm Sunday   . [facility] at [place]                  Get Driving Directions   [address] [place], [place] [address] . 8 am to 8 pm Monday-Friday . 8 am to 4 pm Saturday-Sunday    . [facility] at [place]                    Get Driving Directions  [phone]  [address] [place],  [address]  . Monday-Friday, 12 PM to 6 PM    Your e-visit answers were reviewed by a board certified advanced clinical practitioner to complete your personal care plan.  Thank you for using e-Visits.

## 2019-06-19 ENCOUNTER — Ambulatory Visit (HOSPITAL_COMMUNITY)
Admission: EM | Admit: 2019-06-19 | Discharge: 2019-06-19 | Disposition: A | Discharge status: Home / Self Care | Payer: Managed Care, Other (non HMO) | Attending: Family Medicine | Admitting: Family Medicine

## 2019-06-19 ENCOUNTER — Other Ambulatory Visit: Payer: Self-pay

## 2019-06-19 ENCOUNTER — Ambulatory Visit (INDEPENDENT_AMBULATORY_CARE_PROVIDER_SITE_OTHER): Payer: Managed Care, Other (non HMO)

## 2019-06-19 ENCOUNTER — Encounter (HOSPITAL_COMMUNITY): Payer: Self-pay

## 2019-06-19 DIAGNOSIS — R0602 Shortness of breath: Secondary | ICD-10-CM

## 2019-06-19 DIAGNOSIS — U071 COVID-19: Secondary | ICD-10-CM

## 2019-06-19 HISTORY — DX: COVID-19: U07.1

## 2019-06-19 NOTE — ED Provider Notes (Signed)
[facility]    CSN: [id number] Arrival date & time: 06/19/19  1137      History   Chief Complaint Chief Complaint  Patient presents with  . Shortness of Breath    HPI Sean West is a 28 y.o. male.   Sean West presents with complaints of shortness of breath. Tested positive 10/18 for covid-19 as had fever of 101-102, weakness, shortness of breath, migraine, decreased appetite, congestion and slight cough. Now still with shortness of breath  And cough. Shortness of breath  Has not worsened but has not improved. Worse with activity. Fatigue with activity. Fevers and body aches have improved. Appetite has returned. 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 A coworker of his also tested positive. Inhaler does help some, history of asthma. No other medications for symptoms, hasn't take any tylenol or ibuprofen today. His original work note has him returning in two days, but due to his line of work and persistence of cough he is feeling uncertain about return.  History  Of hiv, hep b.    ROS per HPI, negative if not otherwise mentioned.      Past Medical History:  Diagnosis Date  . COVID-19   . Hepatitis     Patient Active Problem List   Diagnosis Date Noted  . HIV disease (HCC) 11/12/2014  . Acute hepatitis B 11/12/2014  . History of gonorrhea 11/12/2014  . Abnormal liver function tests 10/22/2014    Past Surgical History:  Procedure Laterality Date  . APPENDECTOMY         Home Medications    Prior to Admission medications   Medication Sig Start Date End Date Taking? Authorizing Provider  albuterol (VENTOLIN HFA) 108 (90 Base) MCG/ACT inhaler Inhale 2 puffs into the lungs every 6 (six) hours as needed for wheezing or shortness of breath. 06/17/19   Daphine Deutscher, Mary-Margaret, FNP  amoxicillin (AMOXIL) 875 MG tablet Take 1 tablet (875 mg total) by mouth 2 (two) times daily. 02/15/19   Jannifer Rodney A, FNP   bictegravir-emtricitabine-tenofovir AF (BIKTARVY) 50-200-25 MG TABS tablet Take 1 tablet by mouth daily. 06/14/18   Judyann Munson, MD  imiquimod Mathis Dad) 5 % cream Apply topically 3 (three) times a week. Apply at bedtime to affected areas on buttocks. Wash off in the morning. Do not exceed 10hrs on skin 06/08/18   Judyann Munson, MD    Family History Family History  Problem Relation Age of Onset  . Diabetes Maternal Grandfather   . Liver cancer Paternal Grandfather   . Diabetes Paternal Grandfather     Social History Social History   Tobacco Use  . Smoking status: Never Smoker  . Smokeless tobacco: Never Used  Substance Use Topics  . Alcohol use: Yes    Alcohol/week: 1.0 standard drinks    Types: 1 Glasses of wine per week    Comment: ocassional  . Drug use: No     Allergies   Zyrtec allergy [cetirizine hcl]   Review of Systems Review of Systems   Physical Exam Triage Vital Signs ED Triage Vitals  Enc Vitals Group     BP 06/19/19 1338 (!) 140/92     Pulse Rate 06/19/19 1338 72     Resp 06/19/19 1338 18     Temp 06/19/19 1338 99.5 F (37.5 C)     Temp Source 06/19/19 1338 Oral     SpO2 06/19/19 1338 97 %     Weight 06/19/19 1337 225 lb (102.1 kg)  Height --      Head Circumference --      Peak Flow --      Pain Score 06/19/19 1337 4     Pain Loc --      Pain Edu? --      Excl. in GC? --    No data found.  Updated Vital Signs BP (!) 140/92 (BP Location: Right Arm)   Pulse 72   Temp 99.5 F (37.5 C) (Oral)   Resp 18   Wt 225 lb (102.1 kg)   SpO2 97%   BMI 30.52 kg/m   Visual Acuity Right Eye Distance:   Left Eye Distance:   Bilateral Distance:    Right Eye Near:   Left Eye Near:    Bilateral Near:     Physical Exam Constitutional:      Appearance: He is well-developed.  Cardiovascular:     Rate and Rhythm: Normal rate and regular rhythm.  Pulmonary:     Effort: Pulmonary effort is normal.     Breath sounds: Normal breath sounds.      Comments: Occasional dry cough noted  Skin:    General: Skin is warm and dry.  Neurological:     Mental Status: He is alert and oriented to person, place, and time.      UC Treatments / Results  Labs (all labs ordered are listed, but only abnormal results are displayed) Labs Reviewed - No data to display  EKG   Radiology Dg Chest 2 View  Result Date: 06/19/2019 CLINICAL DATA:  Cough and shortness of breath. History of HIV disease EXAM: CHEST - 2 VIEW COMPARISON:  None. FINDINGS: Lungs are clear. Heart size and pulmonary vascularity are normal. No adenopathy. No bone lesions. IMPRESSION: No edema or consolidation.  No evident adenopathy. Electronically Signed   By: Bretta BangWilliam  Woodruff III M.D.   On: 06/19/2019 13:34    Procedures Procedures (including critical care time)  Medications Ordered in UC Medications - No data to display  Initial Impression / Assessment and Plan / UC Course  I have reviewed the triage vital signs and the nursing notes.  Pertinent labs & imaging results that were available during my care of the patient were reviewed by me and considered in my medical decision making (see chart for details).     covid-19 testing positive 8 days ago with persistent cough and shortness of breath. Fevers have improved. Vitals reassuring, chest xray is reassuring as well. Suspect cough is still residual response to covid-19. Supportive cares recommended. Extended work note provided to get patient to a 14 day quarantine due to persistent cough. Encouraged follow up and management with PCP. Return precautions provided. Patient verbalized understanding and agreeable to plan.    Final Clinical Impressions(s) / UC Diagnoses   Final diagnoses:  Shortness of breath  COVID-19     Discharge Instructions     Your xray is normal today which is reassuring.  Your oxygen and heart rate are still normal as well, which is also reassuring.  I do not see any further indication  for antibiotics- bacterial infection. I am thinking that this is likely still residual symptoms from your covid-19 infection.  You may use your inhaler as needed for wheezing or shortness of breath .  You may try some vitamins to help your immune system potentially:  Vitamin C 500mg  twice a day. Zinc 50mg  daily. Vitamin D 5000IU daily.  Continue to work with your primary care provider's office as needed.  Return to be seen or go to the ER for any worsening of symptoms.     ED Prescriptions    None     PDMP not reviewed this encounter.   Zigmund Gottron, NP 06/19/19 1357

## 2019-06-19 NOTE — Discharge Instructions (Addendum)
Your xray is normal today which is reassuring.  Your oxygen and heart rate are still normal as well, which is also reassuring.  I do not see any further indication for antibiotics- bacterial infection. I am thinking that this is likely still residual symptoms from your covid-19 infection.  You may use your inhaler as needed for wheezing or shortness of breath .  You may try some vitamins to help your immune system potentially:  Vitamin C 500mg  twice a day. Zinc 50mg  daily. Vitamin D 5000IU daily.  Continue to work with your primary care provider's office as needed.  Return to be seen or go to the ER for any worsening of symptoms.

## 2019-06-19 NOTE — ED Triage Notes (Signed)
Pt states he tested positive for Covid last week and he tested again the end of the week now he is negative. Pt states he has SOB now and needs Chest xray.

## 2019-06-23 ENCOUNTER — Telehealth: Payer: Self-pay | Admitting: *Deleted

## 2019-06-23 NOTE — Telephone Encounter (Signed)
Please advise if patient should be seen in clinic or at Urgent Care. 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    ----- Message -----  From: [place]: 06/21/19, 11:14 AM  To: Danice Goltz  Subject: RE: Appointment Request    Hello Mr. Ronnald Ramp,  Sorry for the delayed response. Since you have tested positive for Covid-19 we are unable to see you in person, but we can offer you an e visit with our of our providers. Is this okay? Also where did you get tested for covid?       ----- Message -----    From:Estevan Miller-Jones    Sent:06/18/2019 9:01 AM EDT     FX:[id number] Baxter Flattery, MD  Subject:Appointment Request    Appointment Request From: Danice Goltz    With Provider: Carlyle Basques, MD [facility]]    Preferred Date Range: 06/19/2019 - 06/20/2019    Preferred Times: Any Time    Reason for visit: Office Visit    Comments:  Chest pain, covid, congested, and fatigued

## 2019-06-23 NOTE — Telephone Encounter (Signed)
Sent follow up mychart message to patient after discussion with Marya Amsler and my phone call to patient was unanswered. Landis Gandy, RN

## 2019-08-02 ENCOUNTER — Other Ambulatory Visit: Payer: Managed Care, Other (non HMO)

## 2019-08-02 ENCOUNTER — Other Ambulatory Visit: Payer: Self-pay

## 2019-08-02 ENCOUNTER — Other Ambulatory Visit (HOSPITAL_COMMUNITY)
Admission: RE | Admit: 2019-08-02 | Discharge: 2019-08-02 | Disposition: A | Discharge status: Home / Self Care | Payer: Managed Care, Other (non HMO) | Source: Ambulatory Visit | Attending: Internal Medicine | Admitting: Internal Medicine

## 2019-08-02 DIAGNOSIS — B20 Human immunodeficiency virus [HIV] disease: Secondary | ICD-10-CM

## 2019-08-02 DIAGNOSIS — Z8619 Personal history of other infectious and parasitic diseases: Secondary | ICD-10-CM | POA: Diagnosis present

## 2019-08-03 LAB — T-HELPER CELL (CD4) - (RCID CLINIC ONLY)
CD4 % Helper T Cell: 17 % — ABNORMAL LOW (ref 33–65)
CD4 T Cell Abs: 323 /uL — ABNORMAL LOW (ref 400–1790)

## 2019-08-04 LAB — URINE CYTOLOGY ANCILLARY ONLY
Chlamydia: NEGATIVE
Comment: NEGATIVE
Comment: NORMAL
Neisseria Gonorrhea: NEGATIVE

## 2019-08-08 LAB — COMPREHENSIVE METABOLIC PANEL
AG Ratio: 0.8 (calc) — ABNORMAL LOW (ref 1.0–2.5)
ALT: 16 U/L (ref 9–46)
AST: 26 U/L (ref 10–40)
Albumin: 4.4 g/dL (ref 3.6–5.1)
Alkaline phosphatase (APISO): 53 U/L (ref 36–130)
BUN: 15 mg/dL (ref 7–25)
CO2: 24 mmol/L (ref 20–32)
Calcium: 10.3 mg/dL (ref 8.6–10.3)
Chloride: 102 mmol/L (ref 98–110)
Creat: 1.29 mg/dL (ref 0.60–1.35)
Globulin: 5.3 g/dL (calc) — ABNORMAL HIGH (ref 1.9–3.7)
Glucose, Bld: 75 mg/dL (ref 65–99)
Potassium: 4.6 mmol/L (ref 3.5–5.3)
Sodium: 137 mmol/L (ref 135–146)
Total Bilirubin: 0.8 mg/dL (ref 0.2–1.2)
Total Protein: 9.7 g/dL — ABNORMAL HIGH (ref 6.1–8.1)

## 2019-08-08 LAB — LIPID PANEL
Cholesterol: 185 mg/dL (ref <200)
HDL: 38 mg/dL — ABNORMAL LOW (ref >=40)
LDL Cholesterol (Calc): 114 mg/dL (calc) — ABNORMAL HIGH
Non-HDL Cholesterol (Calc): 147 mg/dL (calc) — ABNORMAL HIGH (ref <130)
Total CHOL/HDL Ratio: 4.9 (calc) (ref <5.0)
Triglycerides: 212 mg/dL — ABNORMAL HIGH (ref <150)

## 2019-08-08 LAB — CBC WITH DIFFERENTIAL/PLATELET
Absolute Monocytes: 484 cells/uL (ref 200–950)
Basophils Absolute: 39 cells/uL (ref 0–200)
Basophils Relative: 1 %
Eosinophils Absolute: 20 cells/uL (ref 15–500)
Eosinophils Relative: 0.5 %
HCT: 46.4 % (ref 38.5–50.0)
Hemoglobin: 15.5 g/dL (ref 13.2–17.1)
Lymphs Abs: 2079 cells/uL (ref 850–3900)
MCH: 27.9 pg (ref 27.0–33.0)
MCHC: 33.4 g/dL (ref 32.0–36.0)
MCV: 83.6 fL (ref 80.0–100.0)
MPV: 11.2 fL (ref 7.5–12.5)
Monocytes Relative: 12.4 %
Neutro Abs: 1279 cells/uL — ABNORMAL LOW (ref 1500–7800)
Neutrophils Relative %: 32.8 %
Platelets: 240 10*3/uL (ref 140–400)
RBC: 5.55 10*6/uL (ref 4.20–5.80)
RDW: 13 % (ref 11.0–15.0)
Total Lymphocyte: 53.3 %
WBC: 3.9 10*3/uL (ref 3.8–10.8)

## 2019-08-08 LAB — FLUORESCENT TREPONEMAL AB(FTA)-IGG-BLD: Fluorescent Treponemal ABS: REACTIVE — AB

## 2019-08-08 LAB — HIV-1 RNA QUANT-NO REFLEX-BLD
HIV 1 RNA Quant: 14700 copies/mL — ABNORMAL HIGH
HIV-1 RNA Quant, Log: 4.17 Log copies/mL — ABNORMAL HIGH

## 2019-08-08 LAB — RPR TITER: RPR Titer: 1:2 {titer} — ABNORMAL HIGH

## 2019-08-08 LAB — RPR: RPR Ser Ql: REACTIVE — AB

## 2019-08-16 ENCOUNTER — Encounter: Payer: Self-pay | Admitting: Internal Medicine

## 2019-08-16 ENCOUNTER — Ambulatory Visit (INDEPENDENT_AMBULATORY_CARE_PROVIDER_SITE_OTHER): Payer: Managed Care, Other (non HMO) | Admitting: Internal Medicine

## 2019-08-16 ENCOUNTER — Other Ambulatory Visit: Payer: Self-pay

## 2019-08-16 VITALS — BP 151/95 | HR 71 | Temp 98.4°F | Ht 72.0 in | Wt 227.0 lb

## 2019-08-16 DIAGNOSIS — A64 Unspecified sexually transmitted disease: Secondary | ICD-10-CM | POA: Diagnosis not present

## 2019-08-16 DIAGNOSIS — A510 Primary genital syphilis: Secondary | ICD-10-CM

## 2019-08-16 DIAGNOSIS — B20 Human immunodeficiency virus [HIV] disease: Secondary | ICD-10-CM | POA: Diagnosis not present

## 2019-08-16 DIAGNOSIS — Z113 Encounter for screening for infections with a predominantly sexual mode of transmission: Secondary | ICD-10-CM | POA: Diagnosis not present

## 2019-08-16 MED ORDER — PENICILLIN G BENZATHINE 1200000 UNIT/2ML IM SUSP
1.2000 10*6.[IU] | Freq: Once | INTRAMUSCULAR | Status: AC
  Administered 2019-08-16: 1.2 10*6.[IU] via INTRAMUSCULAR

## 2019-08-16 NOTE — Progress Notes (Signed)
RFV: follow up for hiv disease  Patient ID: Sean West, male   DOB: 10-28-1990, 28 y.o.   MRN: [id number]  HPI Sean West is a 28yo M with poorly controlled hiv disease. CD 4 count 323/VL 14,700. Lost his job so lost his insurance and didn't realize could get access through clinic. Now back on meds for the past 4-6wk.   Death of father + acute grief  Outpatient Encounter Medications as of 08/16/2019  Medication Sig  . albuterol (VENTOLIN HFA) 108 (90 Base) MCG/ACT inhaler Inhale 2 puffs into the lungs every 6 (six) hours as needed for wheezing or shortness of breath.  . bictegravir-emtricitabine-tenofovir AF (BIKTARVY) 50-200-25 MG TABS tablet Take 1 tablet by mouth daily.  . imiquimod (ALDARA) 5 % cream Apply topically 3 (three) times a week. Apply at bedtime to affected areas on buttocks. Wash off in the morning. Do not exceed 10hrs on skin  . [DISCONTINUED] amoxicillin (AMOXIL) 875 MG tablet Take 1 tablet (875 mg total) by mouth 2 (two) times daily.   No facility-administered encounter medications on file as of 08/16/2019.     Patient Active Problem List   Diagnosis Date Noted  . HIV disease ([place]) 11/12/2014  . Acute hepatitis B 11/12/2014  . History of gonorrhea 11/12/2014  . Abnormal liver function tests 10/22/2014     Health Maintenance Due  Topic Date Due  . TETANUS/TDAP  09/09/2011     Review of Systems 12 point ros is negative except for acute grief, also new skin lesion.  Physical Exam   BP (!) 151/95   Pulse 71   Temp 98.4 F (36.9 C) (Oral)   Ht 6' (1.829 m)   Wt 227 lb (103 kg)   SpO2 99%   BMI 30.79 kg/m   Physical Exam  Constitutional: He is oriented to person, place, and time. He appears well-developed and well-nourished. No distress.  HENT:  Mouth/Throat: Oropharynx is clear and moist. No oropharyngeal exudate.  Cardiovascular: Normal rate, regular rhythm and normal heart sounds. Exam reveals no gallop and no friction rub.  No  murmur heard.  Pulmonary/Chest: Effort normal and breath sounds normal. No respiratory distress. He has no wheezes.  Abdominal: Soft. Bowel sounds are normal. He exhibits no distension. There is no tenderness.  Lymphadenopathy:  He has no cervical adenopathy.  Neurological: He is alert and oriented to person, place, and time.  Skin: Skin is warm and dry. + ulcer on penis Psychiatric: He has a normal mood and affect. His behavior is normal.    Lab Results  Component Value Date   CD4TCELL 17 (L) 08/02/2019   Lab Results  Component Value Date   CD4TABS 323 (L) 08/02/2019   CD4TABS 300 (L) 05/27/2018   CD4TABS 350 (L) 01/20/2017   Lab Results  Component Value Date   HIV1RNAQUANT 14,700 (H) 08/02/2019   No results found for: HEPBSAB Lab Results  Component Value Date   LABRPR REACTIVE (A) 08/02/2019    CBC Lab Results  Component Value Date   WBC 3.9 08/02/2019   RBC 5.55 08/02/2019   HGB 15.5 08/02/2019   HCT 46.4 08/02/2019   PLT 240 08/02/2019   MCV 83.6 08/02/2019   MCH 27.9 08/02/2019   MCHC 33.4 08/02/2019   RDW 13.0 08/02/2019   LYMPHSABS 2,079 08/02/2019   MONOABS 372 11/17/2016   EOSABS 20 08/02/2019    BMET Lab Results  Component Value Date   NA 137 08/02/2019   K 4.6 08/02/2019   CL 102  08/02/2019   CO2 24 08/02/2019   GLUCOSE 75 08/02/2019   BUN 15 08/02/2019   CREATININE 1.29 08/02/2019   CALCIUM 10.3 08/02/2019   GFRNONAA 88 06/07/2018   GFRAA 102 06/07/2018      Assessment and Plan  Syphilis chancre = will treat with im pcn x 1 dose  Will need to be swabbed for sti aswell  hiv disease=  see back in 4 wk to check labs since he had period of not taking meds. Also discussed adherence   Grief counseling = gave name for clinic counselor to help processing his feelings

## 2019-08-17 LAB — RPR: RPR Ser Ql: REACTIVE — AB

## 2019-08-17 LAB — FLUORESCENT TREPONEMAL AB(FTA)-IGG-BLD: Fluorescent Treponemal ABS: REACTIVE — AB

## 2019-08-17 LAB — RPR TITER: RPR Titer: 1:2 {titer} — ABNORMAL HIGH

## 2019-08-21 ENCOUNTER — Other Ambulatory Visit (HOSPITAL_COMMUNITY)
Admission: RE | Admit: 2019-08-21 | Discharge: 2019-08-21 | Disposition: A | Discharge status: Home / Self Care | Payer: Managed Care, Other (non HMO) | Source: Ambulatory Visit | Attending: Internal Medicine | Admitting: Internal Medicine

## 2019-08-21 DIAGNOSIS — Z113 Encounter for screening for infections with a predominantly sexual mode of transmission: Secondary | ICD-10-CM | POA: Diagnosis present

## 2019-08-21 DIAGNOSIS — B20 Human immunodeficiency virus [HIV] disease: Secondary | ICD-10-CM | POA: Diagnosis present

## 2019-08-22 LAB — CYTOLOGY, (ORAL, ANAL, URETHRAL) ANCILLARY ONLY
Chlamydia: NEGATIVE
Chlamydia: NEGATIVE
Comment: NEGATIVE
Comment: NEGATIVE
Comment: NORMAL
Comment: NORMAL
Neisseria Gonorrhea: NEGATIVE
Neisseria Gonorrhea: NEGATIVE

## 2019-08-22 LAB — URINE CYTOLOGY ANCILLARY ONLY
Chlamydia: NEGATIVE
Comment: NEGATIVE
Comment: NORMAL
Neisseria Gonorrhea: NEGATIVE

## 2019-08-22 LAB — HIV-1 RNA QUANT-NO REFLEX-BLD
HIV 1 RNA Quant: 9630 copies/mL — ABNORMAL HIGH
HIV-1 RNA Quant, Log: 3.98 Log copies/mL — ABNORMAL HIGH

## 2019-09-07 ENCOUNTER — Ambulatory Visit: Payer: Managed Care, Other (non HMO)

## 2019-09-13 ENCOUNTER — Ambulatory Visit: Payer: Managed Care, Other (non HMO) | Admitting: Internal Medicine

## 2019-10-02 ENCOUNTER — Encounter: Payer: Self-pay | Admitting: *Deleted

## 2019-10-12 ENCOUNTER — Ambulatory Visit (INDEPENDENT_AMBULATORY_CARE_PROVIDER_SITE_OTHER): Payer: Managed Care, Other (non HMO) | Admitting: Internal Medicine

## 2019-10-12 DIAGNOSIS — Z79899 Other long term (current) drug therapy: Secondary | ICD-10-CM

## 2019-10-12 DIAGNOSIS — Z8619 Personal history of other infectious and parasitic diseases: Secondary | ICD-10-CM | POA: Diagnosis not present

## 2019-10-12 DIAGNOSIS — B20 Human immunodeficiency virus [HIV] disease: Secondary | ICD-10-CM | POA: Diagnosis not present

## 2019-10-12 NOTE — Progress Notes (Signed)
Virtual Visit via Telephone Note  I connected with Sean West on 10/12/19 at  3:45 PM EST by telephone and verified that I am speaking with the correct person using two identifiers.  Location: Patient: at home Provider: clinic   I discussed the limitations, risks, security and privacy concerns of performing an evaluation and management service by telephone and the availability of in person appointments. I also discussed with the patient that there may be a patient responsible charge related to this service. The patient expressed understanding and agreed to proceed.  First attempt. Left to voicemaill. 2nd attempt -spoke with patient  History of Present Illness: 29yo M with hiv disease, with detectable viral load in December (VL 9K) since he had lost health insurance and now back on biktarvy. He reports doing well. No recent illness. Has received covid vaccine and tolerated without side effects. 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 He reports not missing any doses.   In terms of grieving from father's death - he is doing better  ROS: 12 point ros is negative   Observations/Objective: No exam since phone visiit  Assessment and Plan: hiv disease= continue on taking biktarvy daily. 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 The patient was provided an opportunity to ask questions and all were answered. The patient agreed with the plan and demonstrated an understanding of the instructions.   The patient was advised to call back or seek an in-person evaluation if the symptoms worsen or if the condition fails to improve  as anticipated.  I provided 20 minutes of non-face-to-face time during this encounter.   Judyann Munson, MD

## 2019-11-15 ENCOUNTER — Other Ambulatory Visit: Payer: Self-pay

## 2019-11-15 DIAGNOSIS — B20 Human immunodeficiency virus [HIV] disease: Secondary | ICD-10-CM

## 2019-11-17 ENCOUNTER — Other Ambulatory Visit: Payer: Managed Care, Other (non HMO)

## 2019-12-06 ENCOUNTER — Encounter: Payer: Managed Care, Other (non HMO) | Admitting: Internal Medicine

## 2020-03-07 ENCOUNTER — Other Ambulatory Visit: Payer: Managed Care, Other (non HMO)

## 2020-03-07 ENCOUNTER — Other Ambulatory Visit: Payer: Self-pay

## 2020-03-07 DIAGNOSIS — B20 Human immunodeficiency virus [HIV] disease: Secondary | ICD-10-CM

## 2020-03-07 DIAGNOSIS — Z113 Encounter for screening for infections with a predominantly sexual mode of transmission: Secondary | ICD-10-CM

## 2020-03-07 DIAGNOSIS — Z79899 Other long term (current) drug therapy: Secondary | ICD-10-CM

## 2020-03-07 MED ORDER — BICTEGRAVIR-EMTRICITAB-TENOFOV 50-200-25 MG PO TABS: 1.0000 | ORAL_TABLET | Freq: Every day | ORAL | 0 refills | Status: DC

## 2020-03-08 LAB — T-HELPER CELL (CD4) - (RCID CLINIC ONLY)
CD4 % Helper T Cell: 14 % — ABNORMAL LOW (ref 33–65)
CD4 T Cell Abs: 275 /uL — ABNORMAL LOW (ref 400–1790)

## 2020-03-09 LAB — COMPREHENSIVE METABOLIC PANEL
AG Ratio: 0.9 (calc) — ABNORMAL LOW (ref 1.0–2.5)
ALT: 22 U/L (ref 9–46)
AST: 28 U/L (ref 10–40)
Albumin: 4.3 g/dL (ref 3.6–5.1)
Alkaline phosphatase (APISO): 50 U/L (ref 36–130)
BUN: 16 mg/dL (ref 7–25)
CO2: 26 mmol/L (ref 20–32)
Calcium: 9.8 mg/dL (ref 8.6–10.3)
Chloride: 101 mmol/L (ref 98–110)
Creat: 1.28 mg/dL (ref 0.60–1.35)
Globulin: 4.9 g/dL (calc) — ABNORMAL HIGH (ref 1.9–3.7)
Glucose, Bld: 86 mg/dL (ref 65–99)
Potassium: 4.6 mmol/L (ref 3.5–5.3)
Sodium: 134 mmol/L — ABNORMAL LOW (ref 135–146)
Total Bilirubin: 0.8 mg/dL (ref 0.2–1.2)
Total Protein: 9.2 g/dL — ABNORMAL HIGH (ref 6.1–8.1)

## 2020-03-09 LAB — CBC WITH DIFFERENTIAL/PLATELET
Absolute Monocytes: 371 cells/uL (ref 200–950)
Basophils Absolute: 41 cells/uL (ref 0–200)
Basophils Relative: 1.2 %
Eosinophils Absolute: 10 cells/uL — ABNORMAL LOW (ref 15–500)
Eosinophils Relative: 0.3 %
HCT: 44.6 % (ref 38.5–50.0)
Hemoglobin: 15.4 g/dL (ref 13.2–17.1)
Lymphs Abs: 1785 cells/uL (ref 850–3900)
MCH: 28.8 pg (ref 27.0–33.0)
MCHC: 34.5 g/dL (ref 32.0–36.0)
MCV: 83.5 fL (ref 80.0–100.0)
MPV: 10.7 fL (ref 7.5–12.5)
Monocytes Relative: 10.9 %
Neutro Abs: 1193 cells/uL — ABNORMAL LOW (ref 1500–7800)
Neutrophils Relative %: 35.1 %
Platelets: 234 10*3/uL (ref 140–400)
RBC: 5.34 10*6/uL (ref 4.20–5.80)
RDW: 12.8 % (ref 11.0–15.0)
Total Lymphocyte: 52.5 %
WBC: 3.4 10*3/uL — ABNORMAL LOW (ref 3.8–10.8)

## 2020-03-09 LAB — HIV-1 RNA QUANT-NO REFLEX-BLD
HIV 1 RNA Quant: 97 copies/mL — ABNORMAL HIGH
HIV-1 RNA Quant, Log: 1.99 Log copies/mL — ABNORMAL HIGH

## 2020-03-21 ENCOUNTER — Other Ambulatory Visit (HOSPITAL_COMMUNITY)
Admission: RE | Admit: 2020-03-21 | Discharge: 2020-03-21 | Disposition: A | Discharge status: Home / Self Care | Payer: Managed Care, Other (non HMO) | Source: Ambulatory Visit | Attending: Internal Medicine | Admitting: Internal Medicine

## 2020-03-21 ENCOUNTER — Ambulatory Visit (INDEPENDENT_AMBULATORY_CARE_PROVIDER_SITE_OTHER): Payer: Managed Care, Other (non HMO) | Admitting: Internal Medicine

## 2020-03-21 ENCOUNTER — Other Ambulatory Visit: Payer: Self-pay

## 2020-03-21 VITALS — HR 67 | Wt 224.0 lb

## 2020-03-21 DIAGNOSIS — Z8619 Personal history of other infectious and parasitic diseases: Secondary | ICD-10-CM | POA: Diagnosis not present

## 2020-03-21 DIAGNOSIS — B181 Chronic viral hepatitis B without delta-agent: Secondary | ICD-10-CM | POA: Insufficient documentation

## 2020-03-21 DIAGNOSIS — B20 Human immunodeficiency virus [HIV] disease: Secondary | ICD-10-CM | POA: Diagnosis present

## 2020-03-21 DIAGNOSIS — Z113 Encounter for screening for infections with a predominantly sexual mode of transmission: Secondary | ICD-10-CM | POA: Diagnosis present

## 2020-03-21 DIAGNOSIS — B180 Chronic viral hepatitis B with delta-agent: Secondary | ICD-10-CM

## 2020-03-21 NOTE — Progress Notes (Signed)
RFV: follow up for hiv disease  Patient ID: Sean West, male   DOB: 11/16/1990, 29 y.o.   MRN: [id number]  HPI  Sean West is a 29yo M with hiv disease, CD 4 count of 275/VL 97 - Spent 3-4 months at home - went home to help his mom has breast ca. Has young brother 30 [place] and missed a month - of meds.  Working 60-70 hrs per week.  Activated the card --to get copay card.  Outpatient Encounter Medications as of 03/21/2020  Medication Sig  . albuterol (VENTOLIN HFA) 108 (90 Base) MCG/ACT inhaler Inhale 2 puffs into the lungs every 6 (six) hours as needed for wheezing or shortness of breath.  . bictegravir-emtricitabine-tenofovir AF (BIKTARVY) 50-200-25 MG TABS tablet Take 1 tablet by mouth daily.  . imiquimod (ALDARA) 5 % cream Apply topically 3 (three) times a week. Apply at bedtime to affected areas on buttocks. Wash off in the morning. Do not exceed 10hrs on skin   No facility-administered encounter medications on file as of 03/21/2020.     Patient Active Problem List   Diagnosis Date Noted  . HIV disease (HCC) 11/12/2014  . Acute hepatitis B 11/12/2014  . History of gonorrhea 11/12/2014  . Abnormal liver function tests 10/22/2014     Health Maintenance Due  Topic Date Due  . COVID-19 Vaccine (1) 29 y.o.  . TETANUS/TDAP  Never done     Review of Systems Review of Systems  Constitutional: Negative for fever, chills, diaphoresis, activity change, appetite change, fatigue and unexpected weight change.  HENT: Negative for congestion, sore throat, rhinorrhea, sneezing, trouble swallowing and sinus pressure.  Eyes: Negative for photophobia and visual disturbance.  Respiratory: Negative for cough, chest tightness, shortness of breath, wheezing and stridor.  Cardiovascular: Negative for chest pain, palpitations and leg swelling.  Gastrointestinal: Negative for nausea, vomiting, abdominal pain, diarrhea,  constipation, blood in stool, abdominal distention and anal bleeding.  Genitourinary: Negative for dysuria, hematuria, flank pain and difficulty urinating.  Musculoskeletal: Negative for myalgias, back pain, joint swelling, arthralgias and gait problem.  Skin: Negative for color change, pallor, rash and wound.  Neurological: Negative for dizziness, tremors, weakness and light-headedness.  Hematological: Negative for adenopathy. Does not bruise/bleed easily.  Psychiatric/Behavioral: Negative for behavioral problems, confusion, sleep disturbance, dysphoric mood, decreased concentration and agitation.    Physical Exam   Pulse 67   Wt (!) 224 lb (101.6 kg)   SpO2 97%   BMI 30.38 kg/m   Physical Exam  Constitutional: He is oriented to person, place, and time. He appears well-developed and well-nourished. No distress.  HENT:  Mouth/Throat: Oropharynx is clear and moist. No oropharyngeal exudate.  Cardiovascular: Normal rate, regular rhythm and normal heart sounds. Exam reveals no gallop and no friction rub.  No murmur heard.  Pulmonary/Chest: Effort normal and breath sounds normal. No respiratory distress. He has no wheezes.  Abdominal: Soft. Bowel sounds are normal. He exhibits no distension. There is no tenderness.  Lymphadenopathy:  He has no cervical adenopathy.  Neurological: He is alert and oriented to person, place, and time.  Skin: Skin is warm and dry. No rash noted. No erythema.  Psychiatric: He has a normal mood and affect. His behavior is normal.    Lab Results  Component Value Date   CD4TCELL 14 (L) 03/07/2020   Lab Results  Component Value Date   CD4TABS 275 (L) 03/07/2020   CD4TABS 323 (L)  08/02/2019   CD4TABS 300 (L) 05/27/2018   Lab Results  Component Value Date   HIV1RNAQUANT 97 (H) 03/07/2020   No results found for: HEPBSAB Lab Results  Component Value Date   LABRPR REACTIVE (A) 08/16/2019    CBC Lab Results  Component Value Date   WBC 3.4 (L)  03/07/2020   RBC 5.34 03/07/2020   HGB 15.4 03/07/2020   HCT 44.6 03/07/2020   PLT 234 03/07/2020   MCV 83.5 03/07/2020   MCH 28.8 03/07/2020   MCHC 34.5 03/07/2020   RDW 12.8 03/07/2020   LYMPHSABS 1,785 03/07/2020   MONOABS 372 11/17/2016   EOSABS 10 (L) 03/07/2020    BMET Lab Results  Component Value Date   NA 134 (L) 03/07/2020   K 4.6 03/07/2020   CL 101 03/07/2020   CO2 26 03/07/2020   GLUCOSE 86 03/07/2020   BUN 16 03/07/2020   CREATININE 1.28 03/07/2020   CALCIUM 9.8 03/07/2020   GFRNONAA 88 06/07/2018   GFRAA 102 06/07/2018      Assessment and Plan  HIV disease = will recheck VL   Hx of syphilis = will check rpr, and alsi check other sti  Health maintenance = will send vaccine card  Hx of acute hep b = no flair while off of medications- continue of biktarvy

## 2020-03-23 LAB — RPR: RPR Ser Ql: REACTIVE — AB

## 2020-03-23 LAB — RPR TITER: RPR Titer: 1:1 {titer} — ABNORMAL HIGH

## 2020-03-23 LAB — FLUORESCENT TREPONEMAL AB(FTA)-IGG-BLD: Fluorescent Treponemal ABS: REACTIVE — AB

## 2020-03-23 LAB — HEPATITIS B DNA, ULTRAQUANTITATIVE, PCR
Hepatitis B DNA (Calc): <1 Log IU/mL
Hepatitis B DNA: <10 IU/mL

## 2020-03-25 ENCOUNTER — Telehealth: Payer: Self-pay

## 2020-03-25 LAB — CYTOLOGY, (ORAL, ANAL, URETHRAL) ANCILLARY ONLY
Chlamydia: NEGATIVE
Chlamydia: NEGATIVE
Comment: NEGATIVE
Comment: NEGATIVE
Comment: NORMAL
Comment: NORMAL
Neisseria Gonorrhea: NEGATIVE
Neisseria Gonorrhea: NEGATIVE

## 2020-03-25 LAB — URINE CYTOLOGY ANCILLARY ONLY
Chlamydia: NEGATIVE
Comment: NEGATIVE
Comment: NORMAL
Neisseria Gonorrhea: NEGATIVE

## 2020-03-25 NOTE — Telephone Encounter (Signed)
Let him know that rpr is 1:1 reflecting he was treated from the past where it was higher. If he has been exposed to syphilis he should get retreated but if no recent exposure, he does not need treatment at this time. We can recheck rpr in 3 months

## 2020-03-25 NOTE — Telephone Encounter (Signed)
Patient doesn't believe he's been exposed recently but wants to be re-treated just to be sure.   Calvary Difranco Loyola Mast, RN

## 2020-03-25 NOTE — Telephone Encounter (Signed)
Patient sent MyChart message requesting advice about recent RPR tests. Fowarding to Dr. Drue Second for interpretation.  Marialena Wollen Loyola Mast, RN

## 2020-03-25 NOTE — Telephone Encounter (Signed)
I think the 1:1 or 1:2 is his serofast state (new baseline). We will see him back in 3 months to retest especially if sexually active

## 2020-04-01 ENCOUNTER — Other Ambulatory Visit: Payer: Self-pay | Admitting: Internal Medicine

## 2020-09-12 IMAGING — DX DG CHEST 2V
2 series · 2 of 2 positions shown · non-contrast
Comparison: None.

CLINICAL DATA: Cough and shortness of breath. History of HIV
disease

EXAM:
CHEST - 2 VIEW

[chest pa]
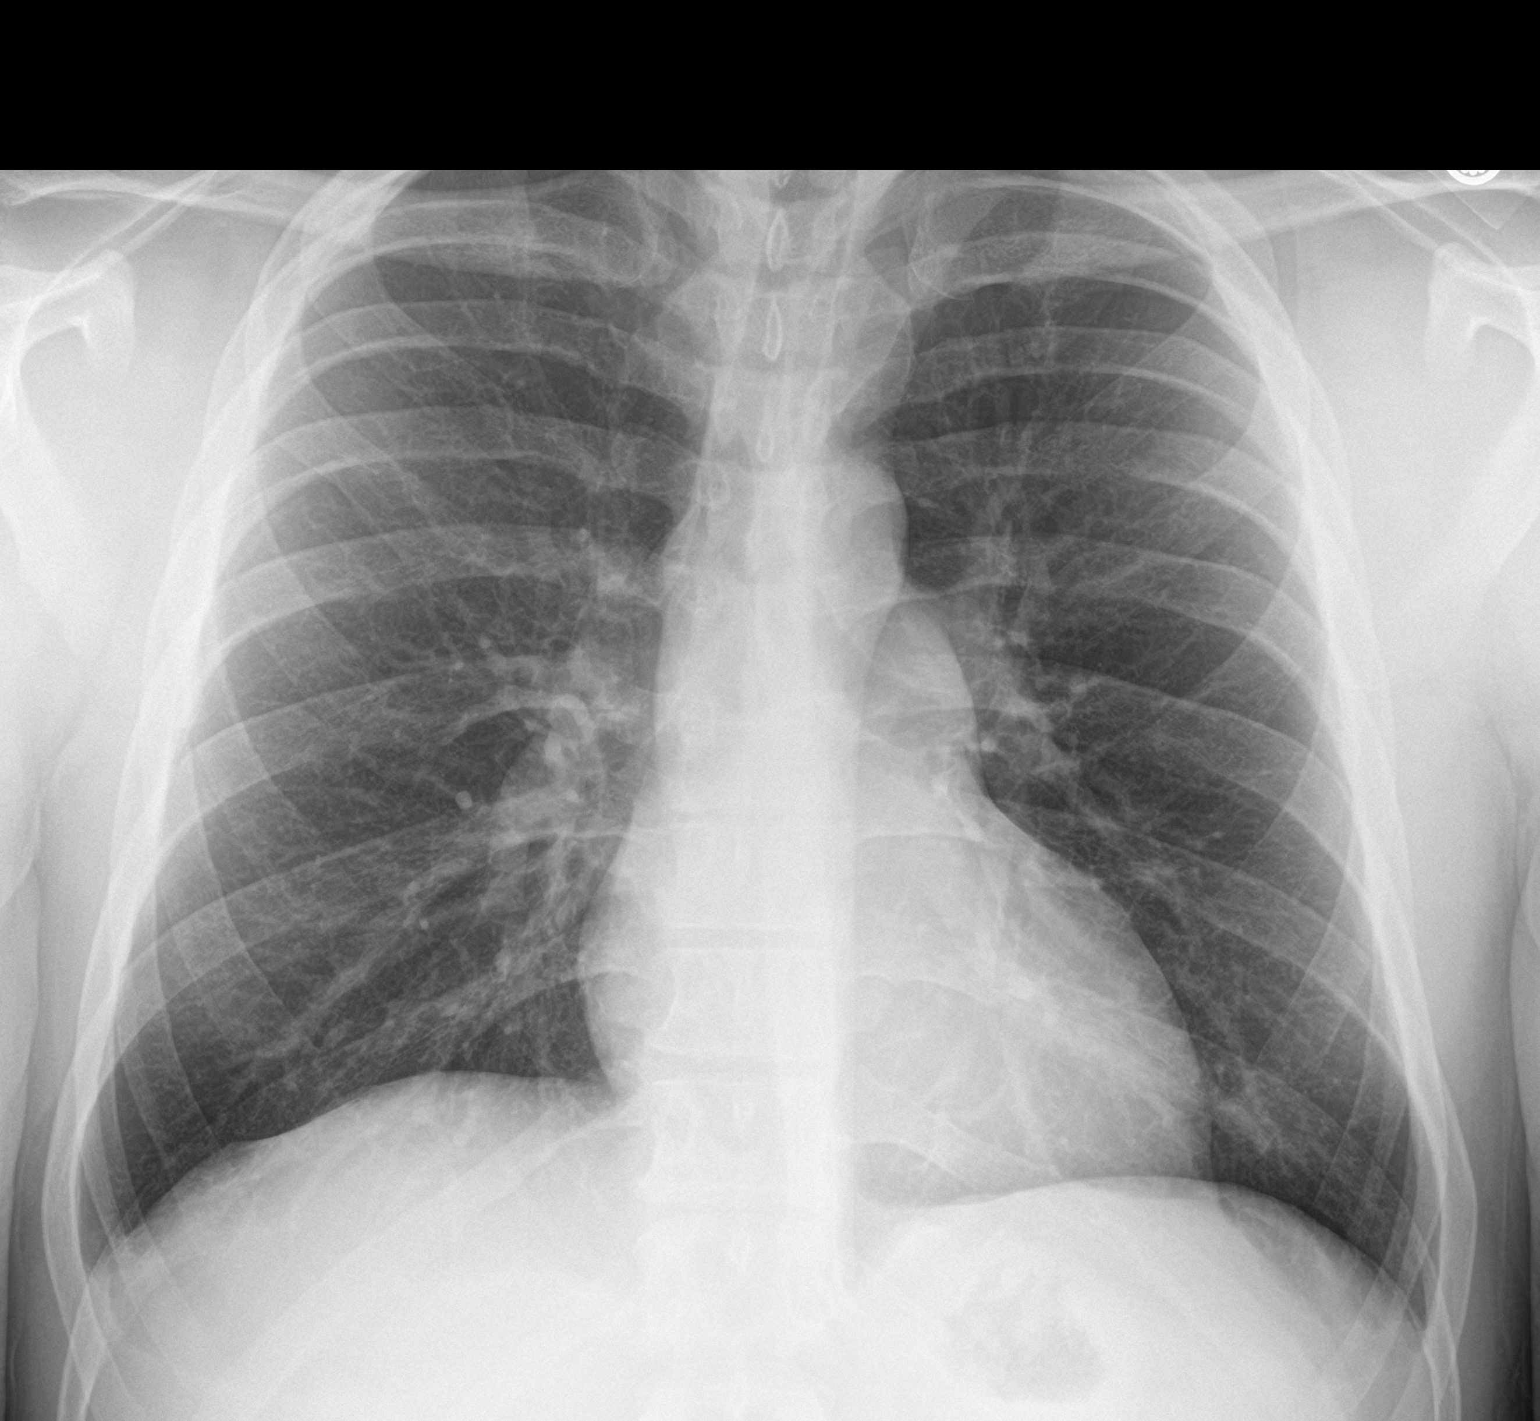

[chest lat]
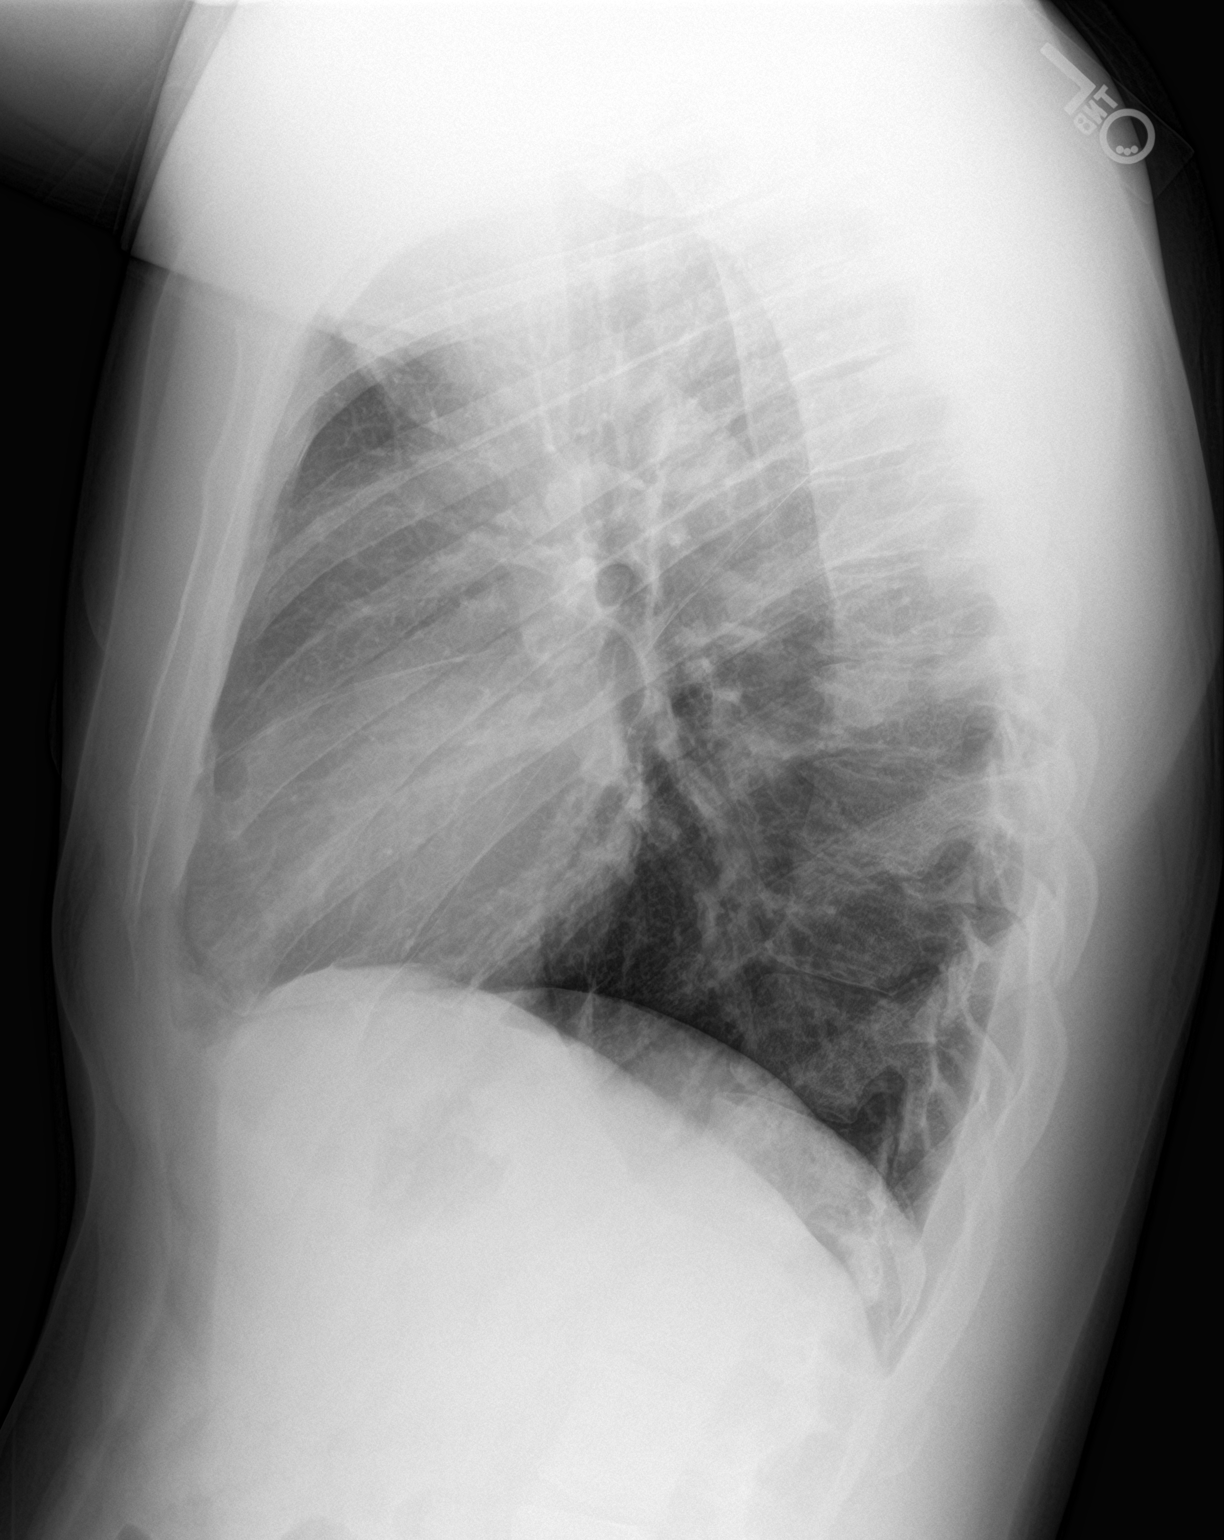

[2 of 2 positions shown; findings below may reference images not displayed]

FINDINGS: Lungs are clear. Heart size and pulmonary vascularity are normal. No
adenopathy. No bone lesions.
IMPRESSION: No edema or consolidation.  No evident adenopathy.

## 2020-10-07 ENCOUNTER — Telehealth: Payer: Self-pay

## 2020-10-07 NOTE — Telephone Encounter (Signed)
Received MyChart message requesting an appointment sometime between 2/18-2/21 for a penile rash and medication delivery. RN attempted to call patient to obtain more information, no answer. Patient scheduled to see Dr. Drue Second on 2/18. RN sent patient MyChart message with appointment time.   Sandie Ano, RN

## 2020-10-11 ENCOUNTER — Ambulatory Visit: Payer: Managed Care, Other (non HMO) | Admitting: Internal Medicine

## 2020-10-16 ENCOUNTER — Other Ambulatory Visit: Payer: Self-pay

## 2020-10-16 ENCOUNTER — Other Ambulatory Visit (HOSPITAL_COMMUNITY)
Admission: RE | Admit: 2020-10-16 | Discharge: 2020-10-16 | Disposition: A | Discharge status: Home / Self Care | Payer: Managed Care, Other (non HMO) | Source: Ambulatory Visit | Attending: Internal Medicine | Admitting: Internal Medicine

## 2020-10-16 ENCOUNTER — Encounter: Payer: Self-pay | Admitting: Internal Medicine

## 2020-10-16 ENCOUNTER — Ambulatory Visit: Payer: Managed Care, Other (non HMO) | Admitting: Internal Medicine

## 2020-10-16 VITALS — BP 143/84 | HR 62 | Temp 98.5°F | Wt 244.0 lb

## 2020-10-16 DIAGNOSIS — R21 Rash and other nonspecific skin eruption: Secondary | ICD-10-CM | POA: Insufficient documentation

## 2020-10-16 DIAGNOSIS — A510 Primary genital syphilis: Secondary | ICD-10-CM | POA: Diagnosis not present

## 2020-10-16 DIAGNOSIS — B181 Chronic viral hepatitis B without delta-agent: Secondary | ICD-10-CM

## 2020-10-16 DIAGNOSIS — B20 Human immunodeficiency virus [HIV] disease: Secondary | ICD-10-CM

## 2020-10-16 DIAGNOSIS — Z79899 Other long term (current) drug therapy: Secondary | ICD-10-CM

## 2020-10-16 DIAGNOSIS — Z113 Encounter for screening for infections with a predominantly sexual mode of transmission: Secondary | ICD-10-CM | POA: Insufficient documentation

## 2020-10-16 MED ORDER — PENICILLIN G BENZATHINE 1200000 UNIT/2ML IM SUSP
1.2000 10*6.[IU] | Freq: Once | INTRAMUSCULAR | Status: AC
Start: 2020-10-16 — End: 2020-10-16
  Administered 2020-10-16: 1.2 10*6.[IU] via INTRAMUSCULAR

## 2020-10-16 MED ORDER — BIKTARVY 50-200-25 MG PO TABS: 1.0000 | ORAL_TABLET | Freq: Every day | ORAL | 11 refills | Status: DC

## 2020-10-16 MED ORDER — PENICILLIN G BENZATHINE 1200000 UNIT/2ML IM SUSP
1.2000 10*6.[IU] | Freq: Once | INTRAMUSCULAR | Status: AC
  Administered 2020-10-16: 1.2 10*6.[IU] via INTRAMUSCULAR

## 2020-10-16 NOTE — Progress Notes (Signed)
Patient ID: Sean West, male   DOB: 06-25-1991, 30 y.o.   MRN: [id number]  HPI 30yo M with well controlled HIV disease. Who noticed Penile rash for past 2 wks told it was herpes but was not given antivirals by urgent care. But questioned the diagnosis. [name] thinks the hsv-1 from bloodwork?  2 weeks, noticed macule 2-3, non tender, increase in size on scrotum now on head of penis  Last had sex with his partner (he found out his partner cheated in January) a week (oral sex and also insertive partner) before his symptoms lesions in mid February. ? Thought he saw a "bump" on partners scrotum. Did not become an ulcer.   No penile discharge  Now notices skin lesions on hands and lesion on tongue (5 days ago- but painful)   uptodate on covid vaccine Outpatient Encounter Medications as of 10/16/2020  Medication Sig  . albuterol (VENTOLIN HFA) 108 (90 Base) MCG/ACT inhaler Inhale 2 puffs into the lungs every 6 (six) hours as needed for wheezing or shortness of breath.  Marland Kitchen BIKTARVY 50-200-25 MG TABS tablet TAKE 1 TABLET BY MOUTH DAILY  . imiquimod (ALDARA) 5 % cream Apply topically 3 (three) times a week. Apply at bedtime to affected areas on buttocks. Wash off in the morning. Do not exceed 10hrs on skin   No facility-administered encounter medications on file as of 10/16/2020.     Patient Active Problem List   Diagnosis Date Noted  . HIV disease (HCC) 11/12/2014  . Acute hepatitis B 11/12/2014  . History of gonorrhea 11/12/2014  . Abnormal liver function tests 10/22/2014     Health Maintenance Due  Topic Date Due  . TETANUS/TDAP  Never done  . INFLUENZA VACCINE  03/24/2020     Review of Systems 12 point ros is negative except what is mentioned above Physical Exam   BP (!) 143/84   Pulse 62   Temp 98.5 F (36.9 C) (Oral)   Wt 244 lb (110.7 kg)   BMI 33.09 kg/m   = 5 lesions of macule on scrotum, and also on his penis = lesion on hands and tip of tongue Lab  Results  Component Value Date   CD4TCELL 14 (L) 03/07/2020   Lab Results  Component Value Date   CD4TABS 275 (L) 03/07/2020   CD4TABS 323 (L) 08/02/2019   CD4TABS 300 (L) 05/27/2018   Lab Results  Component Value Date   HIV1RNAQUANT 97 (H) 03/07/2020   No results found for: HEPBSAB Lab Results  Component Value Date   LABRPR REACTIVE (A) 03/21/2020    CBC Lab Results  Component Value Date   WBC 3.4 (L) 03/07/2020   RBC 5.34 03/07/2020   HGB 15.4 03/07/2020   HCT 44.6 03/07/2020   PLT 234 03/07/2020   MCV 83.5 03/07/2020   MCH 28.8 03/07/2020   MCHC 34.5 03/07/2020   RDW 12.8 03/07/2020   LYMPHSABS 1,785 03/07/2020   MONOABS 372 11/17/2016   EOSABS 10 (L) 03/07/2020    BMET Lab Results  Component Value Date   NA 134 (L) 03/07/2020   K 4.6 03/07/2020   CL 101 03/07/2020   CO2 26 03/07/2020   GLUCOSE 86 03/07/2020   BUN 16 03/07/2020   CREATININE 1.28 03/07/2020   CALCIUM 9.8 03/07/2020   GFRNONAA 88 06/07/2018   GFRAA 102 06/07/2018      Assessment and Plan  HIV disease = continue on biktarvy ( without meds for 2 1/2 weeks). 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 Will alsso need sti testing, we treat with penicillin 2.4MU x 1. Need oral and rectal swab

## 2020-10-17 LAB — CYTOLOGY, (ORAL, ANAL, URETHRAL) ANCILLARY ONLY
Chlamydia: NEGATIVE
Chlamydia: NEGATIVE
Comment: NEGATIVE
Comment: NEGATIVE
Comment: NORMAL
Comment: NORMAL
Neisseria Gonorrhea: NEGATIVE
Neisseria Gonorrhea: NEGATIVE

## 2020-10-17 LAB — URINE CYTOLOGY ANCILLARY ONLY
Chlamydia: NEGATIVE
Comment: NEGATIVE
Comment: NORMAL
Neisseria Gonorrhea: NEGATIVE

## 2020-10-17 LAB — T-HELPER CELL (CD4) - (RCID CLINIC ONLY)
CD4 % Helper T Cell: 16 % — ABNORMAL LOW (ref 33–65)
CD4 T Cell Abs: 266 /uL — ABNORMAL LOW (ref 400–1790)

## 2020-10-18 LAB — HIV-1 RNA QUANT-NO REFLEX-BLD
HIV 1 RNA Quant: 7230 Copies/mL — ABNORMAL HIGH
HIV-1 RNA Quant, Log: 3.86 Log cps/mL — ABNORMAL HIGH

## 2020-10-18 LAB — CBC WITH DIFFERENTIAL/PLATELET
Absolute Monocytes: 388 cells/uL (ref 200–950)
Basophils Absolute: 40 cells/uL (ref 0–200)
Basophils Relative: 1.3 %
Eosinophils Absolute: 9 cells/uL — ABNORMAL LOW (ref 15–500)
Eosinophils Relative: 0.3 %
HCT: 43.1 % (ref 38.5–50.0)
Hemoglobin: 14.5 g/dL (ref 13.2–17.1)
Lymphs Abs: 1575 cells/uL (ref 850–3900)
MCH: 27.6 pg (ref 27.0–33.0)
MCHC: 33.6 g/dL (ref 32.0–36.0)
MCV: 82.1 fL (ref 80.0–100.0)
MPV: 11.1 fL (ref 7.5–12.5)
Monocytes Relative: 12.5 %
Neutro Abs: 1088 cells/uL — ABNORMAL LOW (ref 1500–7800)
Neutrophils Relative %: 35.1 %
Platelets: 226 10*3/uL (ref 140–400)
RBC: 5.25 10*6/uL (ref 4.20–5.80)
RDW: 12.5 % (ref 11.0–15.0)
Total Lymphocyte: 50.8 %
WBC: 3.1 10*3/uL — ABNORMAL LOW (ref 3.8–10.8)

## 2020-10-18 LAB — COMPLETE METABOLIC PANEL WITH GFR
AG Ratio: 0.8 (calc) — ABNORMAL LOW (ref 1.0–2.5)
ALT: 22 U/L (ref 9–46)
AST: 31 U/L (ref 10–40)
Albumin: 4.2 g/dL (ref 3.6–5.1)
Alkaline phosphatase (APISO): 52 U/L (ref 36–130)
BUN: 17 mg/dL (ref 7–25)
CO2: 27 mmol/L (ref 20–32)
Calcium: 9.6 mg/dL (ref 8.6–10.3)
Chloride: 102 mmol/L (ref 98–110)
Creat: 1.26 mg/dL (ref 0.60–1.35)
GFR, Est African American: 89 mL/min/{1.73_m2} (ref >=60)
GFR, Est Non African American: 77 mL/min/{1.73_m2} (ref >=60)
Globulin: 5.4 g/dL (calc) — ABNORMAL HIGH (ref 1.9–3.7)
Glucose, Bld: 75 mg/dL (ref 65–99)
Potassium: 4.3 mmol/L (ref 3.5–5.3)
Sodium: 135 mmol/L (ref 135–146)
Total Bilirubin: 0.7 mg/dL (ref 0.2–1.2)
Total Protein: 9.6 g/dL — ABNORMAL HIGH (ref 6.1–8.1)

## 2020-10-18 LAB — RPR TITER: RPR Titer: 1:64 {titer} — ABNORMAL HIGH

## 2020-10-18 LAB — RPR: RPR Ser Ql: REACTIVE — AB

## 2020-10-18 LAB — LIPID PANEL
Cholesterol: 176 mg/dL (ref <200)
HDL: 36 mg/dL — ABNORMAL LOW (ref >=40)
LDL Cholesterol (Calc): 116 mg/dL (calc) — ABNORMAL HIGH
Non-HDL Cholesterol (Calc): 140 mg/dL (calc) — ABNORMAL HIGH (ref <130)
Total CHOL/HDL Ratio: 4.9 (calc) (ref <5.0)
Triglycerides: 125 mg/dL (ref <150)

## 2020-10-18 LAB — FLUORESCENT TREPONEMAL AB(FTA)-IGG-BLD: Fluorescent Treponemal ABS: REACTIVE — AB

## 2020-10-31 NOTE — Telephone Encounter (Signed)
I spoke with patient and he reports the sores on his hands are healing and the oral lesions have completely healed.  Patient states he still has some markings on his penis that are slowly healing that he was concerned about. Per Judeth Cornfield, NP it will take a while for the lesions to heal and the patient also has to realized his immune system is also fighting the HIV. Patient also needs to make sure he is taking his Biktarvy daily.  At this time patient does need further treatment for syphilis. Patient informed of all information and verbalized understanding. Ladawn Boullion T Pricilla Loveless

## 2020-12-23 ENCOUNTER — Ambulatory Visit: Payer: Managed Care, Other (non HMO) | Admitting: Internal Medicine

## 2020-12-25 ENCOUNTER — Telehealth: Payer: Self-pay

## 2020-12-25 NOTE — Telephone Encounter (Signed)
Attempted to call patient to reschedule missed appointment on 12/23/20, no answer. Left HIPAA compliant voicemail requesting callback.   Kaenan Jake D Bhavya Grand, RN  

## 2021-01-27 ENCOUNTER — Ambulatory Visit: Payer: Managed Care, Other (non HMO) | Admitting: Internal Medicine

## 2021-01-27 ENCOUNTER — Telehealth: Payer: Self-pay | Admitting: *Deleted

## 2021-01-27 NOTE — Telephone Encounter (Signed)
Sure thing. If you get in touch with him, just put him on my schedule anywhere.

## 2021-01-27 NOTE — Telephone Encounter (Signed)
Called patient to see if he was on his way to today's visit; would offer virtual visit if he was available. Call went to voicemail. RN left message asking patient to reschedule today's missed visit. Patient no-showed last month, was detectable at last visit. Andree Coss, RN

## 2021-02-03 ENCOUNTER — Telehealth: Payer: Self-pay

## 2021-02-03 ENCOUNTER — Other Ambulatory Visit: Payer: Self-pay | Admitting: Family

## 2021-02-03 DIAGNOSIS — B20 Human immunodeficiency virus [HIV] disease: Secondary | ICD-10-CM

## 2021-02-03 NOTE — Telephone Encounter (Signed)
Called patient to get an appointment scheduled with Cassie, since missed multiple appointments.. Left a voicemail for patient to call us and get scheduled

## 2021-06-16 ENCOUNTER — Telehealth: Payer: Self-pay

## 2021-06-16 NOTE — Telephone Encounter (Signed)
Called patient to schedule overdue appointment, no answer. Left HIPAA compliant voicemail requesting callback.   Will need to be scheduled with pharmacy.   Sandie Ano, RN

## 2021-07-31 ENCOUNTER — Ambulatory Visit: Payer: Self-pay | Admitting: Internal Medicine

## 2021-09-05 ENCOUNTER — Telehealth: Payer: Self-pay

## 2021-09-05 NOTE — Telephone Encounter (Signed)
Left patient a voice mail to call back to schedule pharmacy visit after a request to see Dr. Drue Second, has missed multiple apportionments

## 2021-09-18 ENCOUNTER — Other Ambulatory Visit: Payer: Self-pay

## 2021-09-18 ENCOUNTER — Other Ambulatory Visit (HOSPITAL_COMMUNITY): Payer: Self-pay

## 2021-09-18 ENCOUNTER — Ambulatory Visit (INDEPENDENT_AMBULATORY_CARE_PROVIDER_SITE_OTHER): Payer: Self-pay | Admitting: Internal Medicine

## 2021-09-18 ENCOUNTER — Encounter: Payer: Self-pay | Admitting: Internal Medicine

## 2021-09-18 ENCOUNTER — Other Ambulatory Visit (HOSPITAL_COMMUNITY)
Admission: RE | Admit: 2021-09-18 | Discharge: 2021-09-18 | Disposition: A | Discharge status: Home / Self Care | Payer: Self-pay | Source: Ambulatory Visit | Attending: Internal Medicine | Admitting: Internal Medicine

## 2021-09-18 ENCOUNTER — Telehealth: Payer: Self-pay

## 2021-09-18 ENCOUNTER — Ambulatory Visit: Payer: Self-pay

## 2021-09-18 VITALS — BP 178/103 | HR 78 | Temp 98.6°F | Wt 251.0 lb

## 2021-09-18 DIAGNOSIS — Z113 Encounter for screening for infections with a predominantly sexual mode of transmission: Secondary | ICD-10-CM

## 2021-09-18 DIAGNOSIS — B20 Human immunodeficiency virus [HIV] disease: Secondary | ICD-10-CM

## 2021-09-18 DIAGNOSIS — Z79899 Other long term (current) drug therapy: Secondary | ICD-10-CM

## 2021-09-18 DIAGNOSIS — Z8619 Personal history of other infectious and parasitic diseases: Secondary | ICD-10-CM

## 2021-09-18 MED ORDER — BIKTARVY 50-200-25 MG PO TABS
1.0000 | ORAL_TABLET | Freq: Every day | ORAL | 11 refills | Status: DC
  Filled 2021-09-18 – 2021-09-19 (×2): qty 30, 30d supply, fill #0
  Filled 2022-02-02 – 2022-02-13 (×2): qty 30, 30d supply, fill #1
  Filled 2022-03-05: qty 30, 30d supply, fill #2
  Filled 2022-04-02: qty 30, 30d supply, fill #3
  Filled 2022-05-04: qty 30, 30d supply, fill #4
  Filled 2022-06-01: qty 30, 30d supply, fill #5
  Filled 2022-07-09 – 2022-07-10 (×2): qty 30, 30d supply, fill #6
  Filled 2022-07-30 – 2022-08-05 (×2): qty 30, 30d supply, fill #7
  Filled 2022-09-17: qty 30, 30d supply, fill #8

## 2021-09-18 NOTE — Telephone Encounter (Signed)
[facility] Patient Advocate Encounter  Completed and sent [employer] Advancing Access application for Biktarvy for this patient who is uninsured.    Patient is approved 09/18/21 through 08/23/22.      Clearance Coots, CPhT Specialty Pharmacy Patient [facility] Phone: [phone] Fax:  [phone]

## 2021-09-18 NOTE — Progress Notes (Signed)
RFV: follow up for HIV disease  Patient ID: Sean West, male   DOB: 1991/03/27, 31 y.o.   MRN: [id number]  HPI 31yo M with HIV disease, CD 4 count 250, VL 7500 with hx of secondary syphilis with RPR on 1:64 but did not return back to clinic earlier last year. He now Works from home since October. No longer has health insurance. 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 He is  Currently on biktarvy, not missing dose. But will be out in 2 wks.  Outpatient Encounter Medications as of 09/18/2021  Medication Sig   albuterol (VENTOLIN HFA) 108 (90 Base) MCG/ACT inhaler Inhale 2 puffs into the lungs every 6 (six) hours as needed for wheezing or shortness of breath.   bictegravir-emtricitabine-tenofovir AF (BIKTARVY) 50-200-25 MG TABS tablet Take 1 tablet by mouth daily.   imiquimod (ALDARA) 5 % cream Apply topically 3 (three) times a week. Apply at bedtime to affected areas on buttocks. Wash off in the morning. Do not exceed 10hrs on skin   No facility-administered encounter medications on file as of 09/18/2021.     Patient Active Problem List   Diagnosis Date Noted   HIV disease ([place]) 11/12/2014   Acute hepatitis B 11/12/2014   History of gonorrhea 11/12/2014   Abnormal liver function tests 10/22/2014     Health Maintenance Due  Topic Date Due   TETANUS/TDAP  Never done   COVID-19 Vaccine (4 - Booster for Moderna series) 08/29/2020   INFLUENZA VACCINE  03/24/2021     Review of Systems Review of Systems  Constitutional: Negative for fever, chills, diaphoresis, activity change, appetite change, fatigue and unexpected weight change.  HENT: Negative for congestion, sore throat, rhinorrhea, sneezing, trouble swallowing and sinus pressure.  Eyes: Negative for photophobia and visual disturbance.  Respiratory: Negative for cough, chest tightness, shortness of breath, wheezing and stridor.  Cardiovascular: Negative for chest pain, palpitations  and leg swelling.  Gastrointestinal: Negative for nausea, vomiting, abdominal pain, diarrhea, constipation, blood in stool, abdominal distention and anal bleeding.  Genitourinary: Negative for dysuria, hematuria, flank pain and difficulty urinating.  Musculoskeletal: Negative for myalgias, back pain, joint swelling, arthralgias and gait problem.  Skin: Negative for color change, pallor, rash and wound.  Neurological: Negative for dizziness, tremors, weakness and light-headedness.  Hematological: Negative for adenopathy. Does not bruise/bleed easily.  Psychiatric/Behavioral: Negative for behavioral problems, confusion, sleep disturbance, dysphoric mood, decreased concentration and agitation.   Physical Exam   BP (!) 178/103    Pulse 78    Temp 98.6 F (37 C) (Temporal)    Wt 251 lb (113.9 kg)    SpO2 98%    BMI 34.04 kg/m   Physical Exam  Constitutional: He is oriented to person, place, and time. He appears well-developed and well-nourished. No distress.  HENT:  Mouth/Throat: Oropharynx is clear and moist. No oropharyngeal exudate.  Cardiovascular: Normal rate, regular rhythm and normal heart sounds. Exam reveals no gallop and no friction rub.  No murmur heard.  Pulmonary/Chest: Effort normal and breath sounds normal. No respiratory distress. He has no wheezes.  Abdominal: Soft. Bowel sounds are normal. He exhibits no distension. There is no tenderness.  Lymphadenopathy:  He has no cervical adenopathy.  Neurological: He is alert and oriented to person, place, and time.  Skin: Skin is warm and dry. No rash noted. No erythema.  Psychiatric: He has a normal mood and affect. His behavior is normal.   Lab Results  Component  Value Date   CD4TCELL 16 (L) 10/16/2020   Lab Results  Component Value Date   CD4TABS 266 (L) 10/16/2020   CD4TABS 275 (L) 03/07/2020   CD4TABS 323 (L) 08/02/2019   Lab Results  Component Value Date   HIV1RNAQUANT 7,230 (H) 10/16/2020   No results found for:  HEPBSAB Lab Results  Component Value Date   LABRPR REACTIVE (A) 10/16/2020    CBC Lab Results  Component Value Date   WBC 3.1 (L) 10/16/2020   RBC 5.25 10/16/2020   HGB 14.5 10/16/2020   HCT 43.1 10/16/2020   PLT 226 10/16/2020   MCV 82.1 10/16/2020   MCH 27.6 10/16/2020   MCHC 33.6 10/16/2020   RDW 12.5 10/16/2020   LYMPHSABS 1,575 10/16/2020   MONOABS 372 11/17/2016   EOSABS 9 (L) 10/16/2020    BMET Lab Results  Component Value Date   NA 135 10/16/2020   K 4.3 10/16/2020   CL 102 10/16/2020   CO2 27 10/16/2020   GLUCOSE 75 10/16/2020   BUN 17 10/16/2020   CREATININE 1.26 10/16/2020   CALCIUM 9.6 10/16/2020   GFRNONAA 77 10/16/2020   GFRAA 89 10/16/2020      Assessment and Plan Hiv disease = will get labs to see if undetectable VL or if need to change regimen Full sti panel Hx of syphilis = no symptoms. But need repeat RPR. May need repeat treatment  Pre-hypertension = unclear if correct reading. Recommend to repeat in clinic and at home to decide if need to start managing  Health maintenance = thinking about monkeypox, flu and Covid bivalent booster -- but does not want to get today

## 2021-09-19 ENCOUNTER — Other Ambulatory Visit (HOSPITAL_COMMUNITY): Payer: Self-pay

## 2021-09-19 LAB — URINE CYTOLOGY ANCILLARY ONLY
Chlamydia: NEGATIVE
Comment: NEGATIVE
Comment: NORMAL
Neisseria Gonorrhea: NEGATIVE

## 2021-09-19 LAB — CYTOLOGY, (ORAL, ANAL, URETHRAL) ANCILLARY ONLY
Chlamydia: NEGATIVE
Chlamydia: NEGATIVE
Comment: NEGATIVE
Comment: NEGATIVE
Comment: NORMAL
Comment: NORMAL
Neisseria Gonorrhea: NEGATIVE
Neisseria Gonorrhea: NEGATIVE

## 2021-09-19 LAB — T-HELPER CELLS (CD4) COUNT (NOT AT ARMC)
Absolute CD4: 334 cells/uL — ABNORMAL LOW (ref 490–1740)
CD4 T Helper %: 22 % — ABNORMAL LOW (ref 30–61)
Total lymphocyte count: 1535 cells/uL (ref 850–3900)

## 2021-09-22 ENCOUNTER — Encounter: Payer: Self-pay | Admitting: Internal Medicine

## 2021-09-22 LAB — LIPID PANEL
Cholesterol: 212 mg/dL — ABNORMAL HIGH (ref <200)
HDL: 38 mg/dL — ABNORMAL LOW (ref >=40)
LDL Cholesterol (Calc): 148 mg/dL (calc) — ABNORMAL HIGH
Non-HDL Cholesterol (Calc): 174 mg/dL (calc) — ABNORMAL HIGH (ref <130)
Total CHOL/HDL Ratio: 5.6 (calc) — ABNORMAL HIGH (ref <5.0)
Triglycerides: 131 mg/dL (ref <150)

## 2021-09-22 LAB — CBC WITH DIFFERENTIAL/PLATELET
Absolute Monocytes: 269 cells/uL (ref 200–950)
Basophils Absolute: 31 cells/uL (ref 0–200)
Basophils Relative: 1.1 %
Eosinophils Absolute: 11 cells/uL — ABNORMAL LOW (ref 15–500)
Eosinophils Relative: 0.4 %
HCT: 43.8 % (ref 38.5–50.0)
Hemoglobin: 14.9 g/dL (ref 13.2–17.1)
Lymphs Abs: 1495 cells/uL (ref 850–3900)
MCH: 28.2 pg (ref 27.0–33.0)
MCHC: 34 g/dL (ref 32.0–36.0)
MCV: 83 fL (ref 80.0–100.0)
MPV: 11.6 fL (ref 7.5–12.5)
Monocytes Relative: 9.6 %
Neutro Abs: 994 cells/uL — ABNORMAL LOW (ref 1500–7800)
Neutrophils Relative %: 35.5 %
Platelets: 210 10*3/uL (ref 140–400)
RBC: 5.28 10*6/uL (ref 4.20–5.80)
RDW: 12.6 % (ref 11.0–15.0)
Total Lymphocyte: 53.4 %
WBC: 2.8 10*3/uL — ABNORMAL LOW (ref 3.8–10.8)

## 2021-09-22 LAB — COMPLETE METABOLIC PANEL WITH GFR
AG Ratio: 1 (calc) (ref 1.0–2.5)
ALT: 17 U/L (ref 9–46)
AST: 21 U/L (ref 10–40)
Albumin: 4.3 g/dL (ref 3.6–5.1)
Alkaline phosphatase (APISO): 46 U/L (ref 36–130)
BUN/Creatinine Ratio: 10 (calc) (ref 6–22)
BUN: 15 mg/dL (ref 7–25)
CO2: 27 mmol/L (ref 20–32)
Calcium: 9.6 mg/dL (ref 8.6–10.3)
Chloride: 105 mmol/L (ref 98–110)
Creat: 1.5 mg/dL — ABNORMAL HIGH (ref 0.60–1.24)
Globulin: 4.5 g/dL (calc) — ABNORMAL HIGH (ref 1.9–3.7)
Glucose, Bld: 87 mg/dL (ref 65–99)
Potassium: 4.2 mmol/L (ref 3.5–5.3)
Sodium: 137 mmol/L (ref 135–146)
Total Bilirubin: 0.9 mg/dL (ref 0.2–1.2)
Total Protein: 8.8 g/dL — ABNORMAL HIGH (ref 6.1–8.1)
eGFR: 64 mL/min/{1.73_m2} (ref >=60)

## 2021-09-22 LAB — RPR: RPR Ser Ql: REACTIVE — AB

## 2021-09-22 LAB — RPR TITER: RPR Titer: 1:4 {titer} — ABNORMAL HIGH

## 2021-09-22 LAB — FLUORESCENT TREPONEMAL AB(FTA)-IGG-BLD: Fluorescent Treponemal ABS: REACTIVE — AB

## 2021-09-22 LAB — HIV-1 RNA QUANT-NO REFLEX-BLD
HIV 1 RNA Quant: <20 Copies/mL — ABNORMAL HIGH
HIV-1 RNA Quant, Log: <1.3 Log cps/mL — ABNORMAL HIGH

## 2021-10-09 ENCOUNTER — Other Ambulatory Visit (HOSPITAL_COMMUNITY): Payer: Self-pay

## 2021-10-13 ENCOUNTER — Other Ambulatory Visit (HOSPITAL_COMMUNITY): Payer: Self-pay

## 2021-10-15 ENCOUNTER — Other Ambulatory Visit (HOSPITAL_COMMUNITY): Payer: Self-pay

## 2022-02-03 ENCOUNTER — Other Ambulatory Visit (HOSPITAL_COMMUNITY): Payer: Self-pay

## 2022-02-13 ENCOUNTER — Other Ambulatory Visit (HOSPITAL_COMMUNITY): Payer: Self-pay

## 2022-03-05 ENCOUNTER — Other Ambulatory Visit (HOSPITAL_COMMUNITY): Payer: Self-pay

## 2022-03-11 ENCOUNTER — Other Ambulatory Visit (HOSPITAL_COMMUNITY): Payer: Self-pay

## 2022-03-16 ENCOUNTER — Ambulatory Visit: Payer: Self-pay | Admitting: Internal Medicine

## 2022-04-02 ENCOUNTER — Other Ambulatory Visit (HOSPITAL_COMMUNITY): Payer: Self-pay

## 2022-04-02 ENCOUNTER — Encounter: Payer: Self-pay | Admitting: Internal Medicine

## 2022-04-07 ENCOUNTER — Other Ambulatory Visit (HOSPITAL_COMMUNITY): Payer: Self-pay

## 2022-04-16 ENCOUNTER — Ambulatory Visit (INDEPENDENT_AMBULATORY_CARE_PROVIDER_SITE_OTHER): Payer: Self-pay | Admitting: Internal Medicine

## 2022-04-16 ENCOUNTER — Encounter: Payer: Self-pay | Admitting: Internal Medicine

## 2022-04-16 ENCOUNTER — Ambulatory Visit: Payer: Self-pay

## 2022-04-16 ENCOUNTER — Other Ambulatory Visit: Payer: Self-pay

## 2022-04-16 VITALS — BP 152/93 | HR 96 | Resp 16 | Ht 72.0 in | Wt 241.0 lb

## 2022-04-16 DIAGNOSIS — R030 Elevated blood-pressure reading, without diagnosis of hypertension: Secondary | ICD-10-CM

## 2022-04-16 DIAGNOSIS — B20 Human immunodeficiency virus [HIV] disease: Secondary | ICD-10-CM

## 2022-04-16 DIAGNOSIS — Z79899 Other long term (current) drug therapy: Secondary | ICD-10-CM

## 2022-04-16 DIAGNOSIS — Z7251 High risk heterosexual behavior: Secondary | ICD-10-CM

## 2022-04-16 MED ORDER — DOXYCYCLINE HYCLATE 100 MG PO TABS: 200.0000 mg | ORAL_TABLET | Freq: Once | ORAL | 0 refills | Status: DC | PRN
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
## 2022-04-17 ENCOUNTER — Encounter: Payer: Self-pay | Admitting: Internal Medicine

## 2022-04-17 LAB — T-HELPER CELL (CD4) - (RCID CLINIC ONLY)
CD4 % Helper T Cell: 18 % — ABNORMAL LOW (ref 33–65)
CD4 T Cell Abs: 335 /uL — ABNORMAL LOW (ref 400–1790)

## 2022-04-18 LAB — COMPLETE METABOLIC PANEL WITH GFR
AG Ratio: 1.1 (calc) (ref 1.0–2.5)
ALT: 18 U/L (ref 9–46)
AST: 23 U/L (ref 10–40)
Albumin: 4.5 g/dL (ref 3.6–5.1)
Alkaline phosphatase (APISO): 51 U/L (ref 36–130)
BUN/Creatinine Ratio: 9 (calc) (ref 6–22)
BUN: 13 mg/dL (ref 7–25)
CO2: 24 mmol/L (ref 20–32)
Calcium: 9.8 mg/dL (ref 8.6–10.3)
Chloride: 103 mmol/L (ref 98–110)
Creat: 1.4 mg/dL — ABNORMAL HIGH (ref 0.60–1.24)
Globulin: 4.1 g/dL (calc) — ABNORMAL HIGH (ref 1.9–3.7)
Glucose, Bld: 90 mg/dL (ref 65–99)
Potassium: 4.2 mmol/L (ref 3.5–5.3)
Sodium: 137 mmol/L (ref 135–146)
Total Bilirubin: 1.1 mg/dL (ref 0.2–1.2)
Total Protein: 8.6 g/dL — ABNORMAL HIGH (ref 6.1–8.1)
eGFR: 70 mL/min/{1.73_m2} (ref >=60)

## 2022-04-18 LAB — CBC WITH DIFFERENTIAL/PLATELET
Absolute Monocytes: 391 cells/uL (ref 200–950)
Basophils Absolute: 42 cells/uL (ref 0–200)
Basophils Relative: 1.1 %
Eosinophils Absolute: 42 cells/uL (ref 15–500)
Eosinophils Relative: 1.1 %
HCT: 44.9 % (ref 38.5–50.0)
Hemoglobin: 15.4 g/dL (ref 13.2–17.1)
Lymphs Abs: 2155 cells/uL (ref 850–3900)
MCH: 28.7 pg (ref 27.0–33.0)
MCHC: 34.3 g/dL (ref 32.0–36.0)
MCV: 83.8 fL (ref 80.0–100.0)
MPV: 11.2 fL (ref 7.5–12.5)
Monocytes Relative: 10.3 %
Neutro Abs: 1170 cells/uL — ABNORMAL LOW (ref 1500–7800)
Neutrophils Relative %: 30.8 %
Platelets: 217 10*3/uL (ref 140–400)
RBC: 5.36 10*6/uL (ref 4.20–5.80)
RDW: 12.9 % (ref 11.0–15.0)
Total Lymphocyte: 56.7 %
WBC: 3.8 10*3/uL (ref 3.8–10.8)

## 2022-04-18 LAB — RPR TITER: RPR Titer: 1:2 {titer} — ABNORMAL HIGH

## 2022-04-18 LAB — HIV-1 RNA QUANT-NO REFLEX-BLD
HIV 1 RNA Quant: 7210 Copies/mL — ABNORMAL HIGH
HIV-1 RNA Quant, Log: 3.86 Log cps/mL — ABNORMAL HIGH

## 2022-04-18 LAB — RPR: RPR Ser Ql: REACTIVE — AB

## 2022-04-18 LAB — FLUORESCENT TREPONEMAL AB(FTA)-IGG-BLD: Fluorescent Treponemal ABS: REACTIVE — AB

## 2022-04-20 LAB — CYTOLOGY, (ORAL, ANAL, URETHRAL) ANCILLARY ONLY
Chlamydia: NEGATIVE
Chlamydia: NEGATIVE
Comment: NEGATIVE
Comment: NEGATIVE
Comment: NORMAL
Comment: NORMAL
Neisseria Gonorrhea: NEGATIVE
Neisseria Gonorrhea: NEGATIVE

## 2022-04-20 LAB — URINE CYTOLOGY ANCILLARY ONLY
Chlamydia: NEGATIVE
Comment: NEGATIVE
Comment: NORMAL
Neisseria Gonorrhea: NEGATIVE

## 2022-04-29 ENCOUNTER — Other Ambulatory Visit (HOSPITAL_COMMUNITY): Payer: Self-pay

## 2022-05-01 ENCOUNTER — Other Ambulatory Visit (HOSPITAL_COMMUNITY): Payer: Self-pay

## 2022-05-04 ENCOUNTER — Other Ambulatory Visit (HOSPITAL_COMMUNITY): Payer: Self-pay

## 2022-05-07 ENCOUNTER — Other Ambulatory Visit (HOSPITAL_COMMUNITY): Payer: Self-pay

## 2022-05-26 ENCOUNTER — Other Ambulatory Visit (HOSPITAL_COMMUNITY): Payer: Self-pay

## 2022-05-28 ENCOUNTER — Other Ambulatory Visit (HOSPITAL_COMMUNITY): Payer: Self-pay

## 2022-06-01 ENCOUNTER — Other Ambulatory Visit (HOSPITAL_COMMUNITY): Payer: Self-pay

## 2022-06-03 ENCOUNTER — Other Ambulatory Visit (HOSPITAL_COMMUNITY): Payer: Self-pay

## 2022-06-03 ENCOUNTER — Other Ambulatory Visit: Payer: Self-pay | Admitting: Internal Medicine

## 2022-06-03 MED ORDER — DOXYCYCLINE HYCLATE 100 MG PO TABS
200.0000 mg | ORAL_TABLET | Freq: Once | ORAL | 0 refills | Status: DC | PRN
  Filled 2022-06-03: qty 20, 10d supply, fill #0

## 2022-06-08 ENCOUNTER — Other Ambulatory Visit (HOSPITAL_COMMUNITY): Payer: Self-pay

## 2022-07-01 ENCOUNTER — Other Ambulatory Visit (HOSPITAL_COMMUNITY): Payer: Self-pay

## 2022-07-02 ENCOUNTER — Ambulatory Visit: Payer: Self-pay | Admitting: Internal Medicine

## 2022-07-06 ENCOUNTER — Telehealth: Payer: Self-pay

## 2022-07-06 NOTE — Telephone Encounter (Signed)
Patient with detectable viral load, called to schedule appointment, no answer. Left HIPAA compliant voicemail requesting callback.   Sandie Ano, RN

## 2022-07-09 ENCOUNTER — Other Ambulatory Visit (HOSPITAL_COMMUNITY): Payer: Self-pay

## 2022-07-09 ENCOUNTER — Other Ambulatory Visit: Payer: Self-pay | Admitting: Internal Medicine

## 2022-07-09 MED ORDER — DOXYCYCLINE HYCLATE 100 MG PO TABS
200.0000 mg | ORAL_TABLET | Freq: Once | ORAL | 0 refills | Status: DC | PRN
  Filled 2022-07-09: qty 20, 10d supply, fill #0

## 2022-07-10 ENCOUNTER — Other Ambulatory Visit (HOSPITAL_COMMUNITY): Payer: Self-pay

## 2022-07-13 ENCOUNTER — Other Ambulatory Visit (HOSPITAL_COMMUNITY): Payer: Self-pay

## 2022-07-27 DIAGNOSIS — Z111 Encounter for screening for respiratory tuberculosis: Secondary | ICD-10-CM | POA: Diagnosis not present

## 2022-07-29 DIAGNOSIS — Z0279 Encounter for issue of other medical certificate: Secondary | ICD-10-CM | POA: Diagnosis not present

## 2022-07-30 ENCOUNTER — Other Ambulatory Visit (HOSPITAL_COMMUNITY): Payer: Self-pay

## 2022-08-05 ENCOUNTER — Other Ambulatory Visit (HOSPITAL_COMMUNITY): Payer: Self-pay

## 2022-08-05 ENCOUNTER — Other Ambulatory Visit: Payer: Self-pay

## 2022-08-07 ENCOUNTER — Other Ambulatory Visit: Payer: Self-pay

## 2022-08-30 DIAGNOSIS — Z7252 High risk homosexual behavior: Secondary | ICD-10-CM | POA: Diagnosis not present

## 2022-08-30 DIAGNOSIS — Z6830 Body mass index (BMI) 30.0-30.9, adult: Secondary | ICD-10-CM | POA: Diagnosis not present

## 2022-08-30 DIAGNOSIS — Z202 Contact with and (suspected) exposure to infections with a predominantly sexual mode of transmission: Secondary | ICD-10-CM | POA: Diagnosis not present

## 2022-08-31 ENCOUNTER — Other Ambulatory Visit (HOSPITAL_COMMUNITY): Payer: Self-pay

## 2022-09-03 ENCOUNTER — Other Ambulatory Visit (HOSPITAL_COMMUNITY): Payer: Self-pay

## 2022-09-03 ENCOUNTER — Telehealth: Payer: Self-pay

## 2022-09-03 NOTE — Telephone Encounter (Signed)
Patient called requesting appointment, he went to [facility] and they screened him. He reports everything was negative, but that he is still waiting on HPV test to come back.   Endorses bumps on the shaft of his penis and believes they are now spreading. Reports a bump has now appeared on his face. He accepts appointment with Silvio Pate, PA tomorrow morning.   Beryle Flock, RN

## 2022-09-04 ENCOUNTER — Other Ambulatory Visit: Payer: Self-pay

## 2022-09-04 ENCOUNTER — Encounter: Payer: Self-pay | Admitting: Physician Assistant

## 2022-09-04 ENCOUNTER — Ambulatory Visit (INDEPENDENT_AMBULATORY_CARE_PROVIDER_SITE_OTHER): Payer: Medicaid Other | Admitting: Physician Assistant

## 2022-09-04 VITALS — BP 144/83 | HR 101 | Temp 101.6°F | Ht 72.0 in | Wt 236.0 lb

## 2022-09-04 DIAGNOSIS — R5081 Fever presenting with conditions classified elsewhere: Secondary | ICD-10-CM | POA: Diagnosis not present

## 2022-09-04 DIAGNOSIS — B04 Monkeypox: Secondary | ICD-10-CM | POA: Diagnosis not present

## 2022-09-04 DIAGNOSIS — Z113 Encounter for screening for infections with a predominantly sexual mode of transmission: Secondary | ICD-10-CM | POA: Diagnosis not present

## 2022-09-04 LAB — POCT INFLUENZA A/B
Influenza A, POC: NEGATIVE
Influenza B, POC: NEGATIVE

## 2022-09-04 MED ORDER — TECOVIRIMAT 200 MG PO CAPS: 300.0000 mg | ORAL_CAPSULE | Freq: Two times a day (BID) | ORAL | 0 refills | Status: AC

## 2022-09-04 NOTE — Patient Instructions (Addendum)
Flu negative, fever most likely due to MPX, Tylenol 500 provided in clinic at 10:58 am next dose due in 4 hours Follow up in 7 days START Tecovirimat - 3 capsules taken TOGETHER twice a day with high fat meal before your medication, as mentioned below    You need 25 grams of fat 30 minutes before each dose of your medication:   This could look like:  -1 avocado -1/4 cup whole cashews  -1/2 cup almonds  -3 tablespoons of nut butter (almond, peanut, cashew)  -4 oz of cooked ground beef 80/20 fat content  -2.5 tablespoons of butter (put it on a bagel, bread!) -3.5 oz goat cheese (regular fat)   You may still experience new lesions coming up over the next 72 hours as the medication begins to work.   Please consider filling out the daily medication / symptom tracker for your convenience. Bring this with you to each follow up visit so we can see how your progress is doing.   You will need to stay isolated at home with well fitting mask over your nose and mouth for 3 weeks after your first rash came up, or until they are all crusted and fall off, whichever is first.   Disinfect common surfaces, bed & bath linens and clothing.   Would like to see you back in 1 week to discuss further - can be done over video visit if you prefer and are overall doing well.   See below for more information.    HUMAN MONKEYPOX ISOLATION RECOMMENDATIONS [email or web address]  A person with monkeypox can spread it to others from the time symptoms start until the rash has fully healed and a fresh layer of skin has formed. The illness typically lasts 2-4 weeks  Monkeypox can spread to anyone through close, personal, often skin-to-skin contact, including: Direct contact with monkeypox rash, scabs, or body fluids from a person with monkeypox. Touching objects, fabrics (clothing, bedding, or towels), and surfaces that have been used by someone with  monkeypox. Contact with respiratory secretions. This direct contact can happen during intimate contact, including: Oral, anal, and vaginal sex or touching the genitals (penis, testicles, labia, and vagina) or anus (butthole) of a person with monkeypox. Hugging, massage, and kissing. Prolonged face-to-face contact. Touching fabrics and objects during sex that were used by a person with monkeypox and that have not been disinfected, such as bedding, towels, fetish gear, and sex toys. A pregnant person can spread the virus to their fetus through the placenta. It's also possible for people to get monkeypox from infected animals, either by being scratched or bitten by the animal or by preparing or eating meat or using products from an infected animal. Isolation typically lasts two to four weeks.   If you are unable to isolate from others:  While symptomatic with a fever or any respiratory symptoms, including sore throat, nasal congestion, or cough, remain isolated in the home and away from others unless it is necessary to see a healthcare provider or for an emergency. This includes avoiding close or physical contact with other people and animals. Cover the lesions, wear a well-fitting mask (more information below), and avoid public transportation when leaving the home as required for medical care or an emergency. While a rash persists but in the absence of a fever or respiratory symptoms Cover all parts of the rash with clothing, gloves, and/or bandages. Wear a well-fitting mask to prevent the wearer from spreading oral and respiratory secretions when interacting with others  until the rash and all other symptoms have resolved. Masks should fit closely on the face without any gaps along the edges or around the nose and be comfortable when worn properly over the nose and mouth.  Until all signs and symptoms of monkeypox illness have fully resolved Do not share items that have been worn or handled with  other people or animals. Launder or disinfect items that have been worn or handled and surfaces that have been touched by a lesion. Avoid close physical contact, including sexual and/or close intimate contact, with other people. Avoid sharing utensils or cups. Items should be cleaned and disinfected before use by others. Avoid crowds and congregate settings. Wash hands often with soap and water or use an alcohol-based hand sanitizer, especially after direct contact with the rash.    Prevention in your Pets:  People with monkeypox should avoid contact with animals (specifically mammals), including pets. If possible, friends or family members should care for healthy animals until the owner has fully recovered. Keep any potentially infectious bandages, textiles (such as clothes, bedding) and other items away from pets, other domestic animals, and wildlife. In general, any mammal may become infected with monkeypox. It is not thought that other animals such as reptiles, fish or birds can be infected. If you notice an animal that had contact with an infected person appears sick (such as lethargy, lack of appetite, coughing, bloating, nasal or eye secretions or crust, fever, rash) contact the Psychologist, prison and probation services, Set designer, or state animal health official.

## 2022-09-04 NOTE — Progress Notes (Signed)
Subjective:    Patient ID: Sean West, male    DOB: 11-14-1990, 32 y.o.   MRN: [id number]  No chief complaint on file.    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 Last OPV for HIV-1 04/16/22 CD4 335, VL 7,210, has been taking Biktarvy without any missed doses. He c/o of lesions that appear initially like "pimples" since 08/30/22. First lesion appeared on shaft of penis and several have developed on all extremities, trunk, face, mouth, feet. He has had fatigue, loss of appetite, fever (mostly at night), mild cough since that time.  He takes Tylenol at night to lower temperature. He is working at a nursing home. Went to a [employer] and was tested for HSV and GC/Chlamydia. Non reactive HSV-1 antibody present but did not perform a viral culture. He reports anonymous condomless penetrative anal intercourse Aug 23, 2022 in [place].  He has not otherwise traveled outside of [place]. He recalls 1 jynneos vaccine at work in 2022. Lesions are painful along penile shaft x 3, appear to become ulcerative. Fever today in clinic is 101.3.                Allergies  Allergen Reactions   Zyrtec Allergy [Cetirizine Hcl] Swelling      Outpatient Medications Prior to Visit  Medication Sig Dispense Refill   albuterol (VENTOLIN HFA) 108 (90 Base) MCG/ACT inhaler Inhale 2 puffs into the lungs every 6 (six) hours as needed for wheezing or shortness of breath. 18 g 0   bictegravir-emtricitabine-tenofovir AF (BIKTARVY) 50-200-25 MG TABS tablet Take 1 tablet by mouth daily. 30 tablet 11   doxycycline (VIBRA-TABS) 100 MG tablet Take 2 tablets (200 mg total) by mouth once as needed for up to 1 dose. Take within 72 hours for post-exposure prevention. 20 tablet 0   imiquimod (ALDARA) 5 % cream Apply topically 3 (three) times a week. Apply at bedtime to affected areas on buttocks. Wash off in the morning. Do not exceed 10hrs on  skin 12 each 0   No facility-administered medications prior to visit.     Past Medical History:  Diagnosis Date   COVID-19    Hepatitis      Past Surgical History:  Procedure Laterality Date   APPENDECTOMY         Review of Systems  Constitutional:  Positive for appetite change, chills, diaphoresis, fatigue and fever.  HENT:  Positive for congestion and mouth sores. Negative for sore throat.   Respiratory:  Positive for cough. Negative for chest tightness, shortness of breath and wheezing.   Cardiovascular:  Negative for chest pain and palpitations.  Gastrointestinal: Negative.  Negative for blood in stool, constipation, diarrhea, nausea and rectal pain.  Genitourinary:  Positive for genital sores and penile pain. Negative for frequency, penile discharge, penile swelling and testicular pain.  Musculoskeletal:  Negative for arthralgias, back pain and joint swelling.  Allergic/Immunologic: Negative for immunocompromised state.  Neurological:  Positive for headaches. Negative for dizziness, weakness and light-headedness.  Hematological:  Positive for adenopathy (bilateral inguinal).  Psychiatric/Behavioral: Negative.        Objective:    BP (!) 144/83   Pulse (!) 101   Temp (!) 101.6 F (38.7 C) (Oral)   Ht 6' (1.829 m)   Wt 236 lb (107 kg)   BMI 32.01 kg/m  Nursing note and vital signs reviewed.  Physical Exam Vitals and nursing note reviewed. Exam conducted with a chaperone  present.  Constitutional:      Appearance: Normal appearance.  HENT:     Head: Normocephalic and atraumatic.     Mouth/Throat:     Mouth: Mucous membranes are moist.     Pharynx: Oropharynx is clear.     Comments: 2 oral mucosal lesions along roof of mouth measuring 3 mm diameter, deep seated, surrounding erythema,  no d/c Eyes:     Extraocular Movements: Extraocular movements intact.     Conjunctiva/sclera: Conjunctivae normal.     Pupils: Pupils are equal, round, and reactive to light.   Cardiovascular:     Pulses: Normal pulses.     Heart sounds: Normal heart sounds.  Pulmonary:     Effort: Pulmonary effort is normal.     Breath sounds: Normal breath sounds.  Genitourinary:    Comments: 3 lesions located along proximal aspect of penile shaft, uldcerative, with some serous discharge, very tender to palpate, measuring 10 mm in diameter Lymphadenopathy:     Lower Body: Right inguinal adenopathy present. Left inguinal adenopathy present.  Skin:    Comments: Multiple lesions that appear to be pustular, vesicular, and ulcerative at varying stages of healing, deep seated diameter between 3 mm to 10 mm, tender to palpate, surrounding erythema, no discharge, no signs of secondary infection. See photographs.  Neurological:     Mental Status: He is alert.  Psychiatric:        Mood and Affect: Mood normal.        Behavior: Behavior normal.        Thought Content: Thought content normal.        Judgment: Judgment normal.         04/16/2022    4:15 PM 10/16/2020    2:12 PM 08/11/2016   10:05 AM 03/17/2016   11:43 AM 07/08/2015    2:02 PM  Depression screen PHQ 2/9  Decreased Interest 0 0 0 0 0  Down, Depressed, Hopeless 0 0 0 0 0  PHQ - 2 Score 0 0 0 0 0       Assessment & Plan:  Fever: POCT flu negative MPX: liklihood of MPX is high with risk factors and clinical consistent with MPX. 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 Reviewed minute clinic results GC/C negative, HSV-1 ab positive Continue taking  Biktarvy as directed Follow up in 1 week Given instruction to isolate at home, to wear mask and cover up if must go out into community for provisions.   Patient Active Problem List   Diagnosis Date Noted   HIV disease ([place]) 11/12/2014   Acute hepatitis B 11/12/2014   History of gonorrhea 11/12/2014   Abnormal liver function tests 10/22/2014     Problem List Items Addressed This Visit   None Visit Diagnoses     Human monkeypox    -  Primary   Relevant Medications   tecovirimat (TPOXX) capsule   Other Relevant Orders   POCT Influenza A/B (Completed)   Monkeypox Virus DNA, Qualitative Real-Time PCR   Monkeypox Virus DNA, Qualitative Real-Time PCR   Monkeypox Virus DNA, Qualitative  Real-Time PCR   RPR   Fever in other diseases       Relevant Orders   POCT Influenza A/B (Completed)   Routine screening for STI (sexually transmitted infection)       Relevant Orders   RPR        I am having Sean West start on tecovirimat. I am also having him maintain his imiquimod, albuterol, Biktarvy, and doxycycline.   Meds ordered this encounter  Medications   tecovirimat (TPOXX) capsule    Sig: Take 1.5 capsules (300 mg total) by mouth 2 (two) times daily with a meal for 14 days.    Dispense:  56 capsule    Refill:  0    Order Specific Question:   Supervising Provider    Answer:   VAN DAM, CORNELIUS N [[id number]]     Follow-up: No follow-ups on file.

## 2022-09-07 ENCOUNTER — Telehealth: Payer: Self-pay

## 2022-09-07 LAB — MONKEYPOX VIRUS DNA, QUALITATIVE REAL-TIME PCR
MONKEYPOX VIRUS DNA, QL PCR: DETECTED — AB
MONKEYPOX VIRUS DNA, QL PCR: DETECTED — AB
MONKEYPOX VIRUS DNA, QL PCR: DETECTED — AB
Orthopoxvirus DNA, QL PCR: DETECTED — AB
Orthopoxvirus DNA, QL PCR: DETECTED — AB
Orthopoxvirus DNA, QL PCR: DETECTED — AB

## 2022-09-07 LAB — RPR TITER: RPR Titer: 1:2 {titer} — ABNORMAL HIGH

## 2022-09-07 LAB — T PALLIDUM AB: T Pallidum Abs: POSITIVE — AB

## 2022-09-07 LAB — RPR: RPR Ser Ql: REACTIVE — AB

## 2022-09-07 NOTE — Telephone Encounter (Signed)
Patient called office with questions regarding RPR titer and needing treatment for syphillis. Informed patient that since he did not have an increase with RPR titer no treatment was needed.  Did inform him that his swabs did come back positive or monkey pox virus. Advised he continue with TPOXX treatment and continue to isolate at home.  Understands provider will reach out later this week. Leatrice Jewels, RMA

## 2022-09-08 ENCOUNTER — Other Ambulatory Visit (HOSPITAL_COMMUNITY): Payer: Self-pay

## 2022-09-10 ENCOUNTER — Telehealth: Payer: Self-pay

## 2022-09-10 ENCOUNTER — Ambulatory Visit: Payer: Medicaid Other | Admitting: Physician Assistant

## 2022-09-10 NOTE — Telephone Encounter (Signed)
Pt called and left a VM to reschedule appt w/ Claiborne Billings. Called pt back and left HIPAA Compliant VM. Will cancel appt for the time being and will send a MyChart msg.

## 2022-09-11 ENCOUNTER — Encounter: Payer: Self-pay | Admitting: Physician Assistant

## 2022-09-11 ENCOUNTER — Telehealth: Payer: Self-pay

## 2022-09-11 NOTE — Telephone Encounter (Signed)
Patient called office today regarding return to work note. States that note originally had him returning to work today, but wanted to confirm this was still correct. States that overall his lesions have dried over. Lesion on penis has not completely dried out, but has made improvement.  Claiborne Billings PA has extended patient leave or work through 1/22. Letter sent to patient's mychart. Per PA patient is okay to go out in public but will need to cover lesions and wear mask. Will need to refrain from sexual activity until all lesions are sloughed off and new skin is present at site of lesions. Relayed this information to patient. Verbalized understanding. Is aware of upcoming appointment and understand he needs to keep appointment.  Leatrice Jewels, RMA

## 2022-09-17 ENCOUNTER — Other Ambulatory Visit (HOSPITAL_COMMUNITY): Payer: Self-pay

## 2022-09-17 ENCOUNTER — Encounter: Payer: Self-pay | Admitting: Physician Assistant

## 2022-09-17 ENCOUNTER — Other Ambulatory Visit: Payer: Self-pay | Admitting: Internal Medicine

## 2022-09-17 ENCOUNTER — Other Ambulatory Visit: Payer: Self-pay

## 2022-09-17 ENCOUNTER — Ambulatory Visit (INDEPENDENT_AMBULATORY_CARE_PROVIDER_SITE_OTHER): Payer: Medicaid Other | Admitting: Physician Assistant

## 2022-09-17 VITALS — BP 139/85 | HR 66 | Temp 98.6°F | Wt 235.4 lb

## 2022-09-17 DIAGNOSIS — B04 Monkeypox: Secondary | ICD-10-CM | POA: Diagnosis not present

## 2022-09-17 MED ORDER — DOXYCYCLINE HYCLATE 100 MG PO TABS
200.0000 mg | ORAL_TABLET | Freq: Once | ORAL | 0 refills | Status: DC | PRN
  Filled 2022-09-17 – 2022-10-14 (×3): qty 20, 10d supply, fill #0

## 2022-09-17 NOTE — Patient Instructions (Signed)
Trei you no longer need to isolate Use vitamin E oil for scarring Continue taking biktarvy as directed  Condoms always Follow up as directed

## 2022-09-17 NOTE — Progress Notes (Signed)
Subjective:    Patient ID: Sean West, male    DOB: 1991/05/07, 32 y.o.   MRN: [id number]  Chief Complaint  Patient presents with   Follow-up    Human MonkeyPox      HPI:  Sean West is a 32 y.o. male who presents today for follow up regarding MPX. He was treated 09/04/22 with Tecovirimat.  He has been adherent to regimen, as well as ARV regimen and has not missed any doses and denies any adverse events.  He has been taking as directed following high fat meal.  He has had no new lesions appear since initiation with Tecovirimat.  All lesions have resolved with new skin growth.  He has been working from home.  He denies any night sweats, fever, diarrhea, sob, HA, vision changes, abd pain, CP.  He has remained isolated throughout treatment working remotely from home.    Allergies  Allergen Reactions   Zyrtec Allergy [Cetirizine Hcl] Swelling      Outpatient Medications Prior to Visit  Medication Sig Dispense Refill   albuterol (VENTOLIN HFA) 108 (90 Base) MCG/ACT inhaler Inhale 2 puffs into the lungs every 6 (six) hours as needed for wheezing or shortness of breath. 18 g 0   bictegravir-emtricitabine-tenofovir AF (BIKTARVY) 50-200-25 MG TABS tablet Take 1 tablet by mouth daily. 30 tablet 11   doxycycline (VIBRA-TABS) 100 MG tablet Take 2 tablets (200 mg total) by mouth once as needed for up to 1 dose. Take within 72 hours for post-exposure prevention. 20 tablet 0   tecovirimat (TPOXX) capsule Take 1.5 capsules (300 mg total) by mouth 2 (two) times daily with a meal for 14 days. 56 capsule 0   imiquimod (ALDARA) 5 % cream Apply topically 3 (three) times a week. Apply at bedtime to affected areas on buttocks. Wash off in the morning. Do not exceed 10hrs on skin (Patient not taking: Reported on 09/17/2022) 12 each 0   No facility-administered medications prior to visit.     Past Medical History:  Diagnosis Date   COVID-19    Hepatitis      Past Surgical  History:  Procedure Laterality Date   APPENDECTOMY         Review of Systems  Constitutional:  Negative for appetite change, chills, diaphoresis, fatigue and fever.  HENT: Negative.    Eyes:  Negative for visual disturbance.  Respiratory:  Negative for cough, shortness of breath and wheezing.   Cardiovascular:  Negative for chest pain, palpitations and leg swelling.  Gastrointestinal:  Negative for constipation, diarrhea, nausea and vomiting.  Genitourinary: Negative.   Musculoskeletal:  Negative for arthralgias and back pain.  Allergic/Immunologic: Negative for immunocompromised state.  Hematological:  Negative for adenopathy.  Psychiatric/Behavioral: Negative.        Objective:    BP 139/85   Pulse 66   Temp 98.6 F (37 C) (Oral)   Wt 235 lb 6.4 oz (106.8 kg)   SpO2 96%   BMI 31.93 kg/m  Nursing note and vital signs reviewed.  Physical Exam Vitals reviewed.  Constitutional:      Appearance: Normal appearance. He is normal weight.  HENT:     Head: Normocephalic and atraumatic.  Eyes:     Extraocular Movements: Extraocular movements intact.     Conjunctiva/sclera: Conjunctivae normal.     Pupils: Pupils are equal, round, and reactive to light.  Cardiovascular:     Rate and Rhythm: Normal rate and regular rhythm.  Pulmonary:     Effort: Pulmonary  effort is normal.     Breath sounds: Normal breath sounds.  Musculoskeletal:        General: Normal range of motion.  Skin:    General: Skin is warm and dry.     Findings: No lesion (all previous lesions have new skin growth and are hypopigmented) or rash.  Neurological:     General: No focal deficit present.     Mental Status: He is alert and oriented to person, place, and time.  Psychiatric:        Mood and Affect: Mood normal.        Behavior: Behavior normal.        Thought Content: Thought content normal.        Judgment: Judgment normal.         09/17/2022   10:05 AM 04/16/2022    4:15 PM 10/16/2020     2:12 PM 08/11/2016   10:05 AM 03/17/2016   11:43 AM  Depression screen PHQ 2/9  Decreased Interest 0 0 0 0 0  Down, Depressed, Hopeless 0 0 0 0 0  PHQ - 2 Score 0 0 0 0 0       Assessment & Plan:   MPX-confirmed and treated with TPOXX, tolerated well, no new lesions Advised to apply vitamin E oil for scarring Directed he is no longer needing to isolate, he has sanitized home and cleaned linens in hot water Advised to always use condoms Follow up as needed with provider Patient Active Problem List   Diagnosis Date Noted   HIV disease ([place]) 11/12/2014   Acute hepatitis B 11/12/2014   History of gonorrhea 11/12/2014   Abnormal liver function tests 10/22/2014     Problem List Items Addressed This Visit   None Visit Diagnoses     Human monkeypox    -  Primary        I have discontinued [name]'s imiquimod. I am also having him maintain his albuterol, Biktarvy, doxycycline, and tecovirimat.   No orders of the defined types were placed in this encounter.    Follow-up: Return if symptoms worsen or fail to improve.

## 2022-09-18 ENCOUNTER — Encounter (HOSPITAL_COMMUNITY): Payer: Self-pay

## 2022-09-18 ENCOUNTER — Other Ambulatory Visit (HOSPITAL_COMMUNITY): Payer: Self-pay

## 2022-09-18 ENCOUNTER — Other Ambulatory Visit: Payer: Self-pay

## 2022-09-21 ENCOUNTER — Telehealth: Payer: Self-pay

## 2022-09-21 NOTE — Telephone Encounter (Signed)
Mychart msg sent. AS, CMA 

## 2022-09-23 ENCOUNTER — Other Ambulatory Visit: Payer: Self-pay

## 2022-10-14 ENCOUNTER — Other Ambulatory Visit (HOSPITAL_COMMUNITY): Payer: Self-pay

## 2022-10-14 ENCOUNTER — Other Ambulatory Visit: Payer: Self-pay

## 2022-10-14 ENCOUNTER — Other Ambulatory Visit: Payer: Self-pay | Admitting: Internal Medicine

## 2022-10-14 DIAGNOSIS — B20 Human immunodeficiency virus [HIV] disease: Secondary | ICD-10-CM

## 2022-10-14 MED ORDER — BIKTARVY 50-200-25 MG PO TABS
1.0000 | ORAL_TABLET | Freq: Every day | ORAL | 0 refills | Status: DC
  Filled 2022-10-14 – 2022-10-21 (×2): qty 30, 30d supply, fill #0

## 2022-10-15 ENCOUNTER — Other Ambulatory Visit (HOSPITAL_COMMUNITY): Payer: Self-pay

## 2022-10-16 ENCOUNTER — Other Ambulatory Visit (HOSPITAL_COMMUNITY): Payer: Self-pay

## 2022-10-21 ENCOUNTER — Other Ambulatory Visit (HOSPITAL_COMMUNITY): Payer: Self-pay

## 2022-10-21 ENCOUNTER — Other Ambulatory Visit: Payer: Self-pay

## 2022-11-09 ENCOUNTER — Other Ambulatory Visit: Payer: Self-pay

## 2022-11-11 ENCOUNTER — Other Ambulatory Visit: Payer: Self-pay | Admitting: Internal Medicine

## 2022-11-11 ENCOUNTER — Other Ambulatory Visit: Payer: Self-pay

## 2022-11-11 DIAGNOSIS — B20 Human immunodeficiency virus [HIV] disease: Secondary | ICD-10-CM

## 2022-11-12 ENCOUNTER — Other Ambulatory Visit: Payer: Self-pay

## 2022-11-12 ENCOUNTER — Other Ambulatory Visit (HOSPITAL_COMMUNITY): Payer: Self-pay

## 2022-11-12 MED ORDER — BIKTARVY 50-200-25 MG PO TABS
1.0000 | ORAL_TABLET | Freq: Every day | ORAL | 0 refills | Status: DC
  Filled 2022-11-12: qty 30, 30d supply, fill #0

## 2022-11-17 ENCOUNTER — Other Ambulatory Visit: Payer: Self-pay

## 2022-12-01 ENCOUNTER — Other Ambulatory Visit: Payer: Self-pay | Admitting: Internal Medicine

## 2022-12-01 ENCOUNTER — Other Ambulatory Visit: Payer: Self-pay

## 2022-12-01 MED ORDER — DOXYCYCLINE HYCLATE 100 MG PO TABS
200.0000 mg | ORAL_TABLET | Freq: Once | ORAL | 0 refills | Status: DC | PRN
  Filled 2022-12-01: qty 20, 10d supply, fill #0

## 2022-12-02 ENCOUNTER — Other Ambulatory Visit: Payer: Self-pay

## 2022-12-08 ENCOUNTER — Other Ambulatory Visit (HOSPITAL_COMMUNITY): Payer: Self-pay

## 2022-12-10 ENCOUNTER — Other Ambulatory Visit (HOSPITAL_COMMUNITY): Payer: Self-pay

## 2022-12-15 ENCOUNTER — Other Ambulatory Visit (HOSPITAL_COMMUNITY): Payer: Self-pay

## 2022-12-15 ENCOUNTER — Other Ambulatory Visit: Payer: Self-pay | Admitting: Internal Medicine

## 2022-12-15 DIAGNOSIS — B20 Human immunodeficiency virus [HIV] disease: Secondary | ICD-10-CM

## 2022-12-15 NOTE — Telephone Encounter (Signed)
Last dispensed 3/27, patient will receive refills at 4/25 appointment.   Sandie Ano, RN

## 2022-12-17 ENCOUNTER — Ambulatory Visit: Payer: Medicaid Other | Admitting: Pharmacist

## 2022-12-17 ENCOUNTER — Other Ambulatory Visit (HOSPITAL_COMMUNITY): Payer: Self-pay

## 2022-12-18 ENCOUNTER — Other Ambulatory Visit (HOSPITAL_COMMUNITY): Payer: Self-pay

## 2022-12-21 ENCOUNTER — Other Ambulatory Visit (HOSPITAL_COMMUNITY): Payer: Self-pay

## 2022-12-31 ENCOUNTER — Other Ambulatory Visit (HOSPITAL_COMMUNITY): Payer: Self-pay

## 2023-01-24 ENCOUNTER — Other Ambulatory Visit: Payer: Self-pay | Admitting: Internal Medicine

## 2023-01-25 ENCOUNTER — Other Ambulatory Visit: Payer: Self-pay

## 2023-01-25 MED ORDER — DOXYCYCLINE HYCLATE 100 MG PO TABS
200.0000 mg | ORAL_TABLET | Freq: Once | ORAL | 0 refills | Status: DC | PRN
  Filled 2023-01-25 – 2023-03-10 (×2): qty 20, 10d supply, fill #0

## 2023-01-26 ENCOUNTER — Encounter: Payer: Self-pay | Admitting: Pharmacist

## 2023-01-26 ENCOUNTER — Other Ambulatory Visit: Payer: Self-pay

## 2023-01-28 ENCOUNTER — Other Ambulatory Visit (HOSPITAL_COMMUNITY): Payer: Self-pay

## 2023-01-29 ENCOUNTER — Other Ambulatory Visit: Payer: Self-pay

## 2023-02-26 ENCOUNTER — Other Ambulatory Visit (HOSPITAL_COMMUNITY): Payer: Self-pay

## 2023-03-04 ENCOUNTER — Other Ambulatory Visit (HOSPITAL_COMMUNITY): Payer: Self-pay

## 2023-03-11 ENCOUNTER — Other Ambulatory Visit (HOSPITAL_COMMUNITY): Payer: Self-pay

## 2023-03-11 ENCOUNTER — Other Ambulatory Visit: Payer: Self-pay

## 2023-03-15 ENCOUNTER — Other Ambulatory Visit: Payer: Self-pay

## 2023-03-15 ENCOUNTER — Telehealth: Payer: Self-pay | Admitting: Pharmacist

## 2023-03-15 NOTE — Telephone Encounter (Signed)
[facility] has been unable to reach patient for Biktarvy refills; he last filled in March. Clinic has also been trying to reach for an appointment since he has not been seen since January; he needs an office visit for further refills.   Please schedule appointment with Dr. Drue Second if you are able to reach patient via MyChart or phone.  Thank you!  Margarite Gouge, PharmD, CPP, BCIDP, AAHIVP Clinical Pharmacist Practitioner Infectious Diseases Clinical Pharmacist [facility]

## 2023-03-23 NOTE — Telephone Encounter (Signed)
Thank you :)

## 2023-03-31 ENCOUNTER — Ambulatory Visit: Payer: Medicaid Other | Admitting: Internal Medicine

## 2023-05-11 ENCOUNTER — Encounter: Payer: Self-pay | Admitting: Internal Medicine

## 2023-05-12 ENCOUNTER — Other Ambulatory Visit (HOSPITAL_COMMUNITY): Payer: Self-pay

## 2023-05-12 ENCOUNTER — Other Ambulatory Visit: Payer: Self-pay

## 2023-05-12 MED ORDER — DOXYCYCLINE HYCLATE 100 MG PO TABS
200.0000 mg | ORAL_TABLET | Freq: Once | ORAL | 2 refills | Status: AC | PRN
  Filled 2023-05-12: qty 30, 15d supply, fill #0
  Filled 2024-01-04: qty 30, 15d supply, fill #1
  Filled 2024-02-29: qty 30, 15d supply, fill #2

## 2023-05-20 ENCOUNTER — Other Ambulatory Visit: Payer: Self-pay | Admitting: Internal Medicine

## 2023-05-20 DIAGNOSIS — B181 Chronic viral hepatitis B without delta-agent: Secondary | ICD-10-CM

## 2023-05-31 ENCOUNTER — Ambulatory Visit: Payer: Medicaid Other | Admitting: Internal Medicine

## 2023-06-30 ENCOUNTER — Ambulatory Visit: Payer: Medicaid Other | Admitting: Internal Medicine

## 2023-07-02 NOTE — Telephone Encounter (Signed)
Attempted to contact patient again due to missed office visit on 06/30/23 with Dr. Drue Second. Left patient HIPAA compliant voice message to contact [facility] provider.   *Mychart previously sent, patient has not read at this time.  Valarie Cones, LPN

## 2023-09-15 ENCOUNTER — Other Ambulatory Visit: Payer: Self-pay | Admitting: Internal Medicine

## 2023-09-15 DIAGNOSIS — B20 Human immunodeficiency virus [HIV] disease: Secondary | ICD-10-CM

## 2023-09-20 ENCOUNTER — Other Ambulatory Visit (HOSPITAL_COMMUNITY)
Admission: RE | Admit: 2023-09-20 | Discharge: 2023-09-20 | Disposition: A | Discharge status: Home / Self Care | Payer: 59 | Source: Ambulatory Visit | Attending: Internal Medicine | Admitting: Internal Medicine

## 2023-09-20 ENCOUNTER — Other Ambulatory Visit (HOSPITAL_COMMUNITY): Payer: Self-pay

## 2023-09-20 ENCOUNTER — Ambulatory Visit (INDEPENDENT_AMBULATORY_CARE_PROVIDER_SITE_OTHER): Payer: 59 | Admitting: Pharmacist

## 2023-09-20 ENCOUNTER — Other Ambulatory Visit: Payer: Self-pay

## 2023-09-20 DIAGNOSIS — Z113 Encounter for screening for infections with a predominantly sexual mode of transmission: Secondary | ICD-10-CM | POA: Diagnosis present

## 2023-09-20 DIAGNOSIS — Z0000 Encounter for general adult medical examination without abnormal findings: Secondary | ICD-10-CM

## 2023-09-20 DIAGNOSIS — Z79899 Other long term (current) drug therapy: Secondary | ICD-10-CM

## 2023-09-20 DIAGNOSIS — B20 Human immunodeficiency virus [HIV] disease: Secondary | ICD-10-CM

## 2023-09-20 DIAGNOSIS — J45909 Unspecified asthma, uncomplicated: Secondary | ICD-10-CM

## 2023-09-20 DIAGNOSIS — Z23 Encounter for immunization: Secondary | ICD-10-CM | POA: Diagnosis not present

## 2023-09-20 DIAGNOSIS — B181 Chronic viral hepatitis B without delta-agent: Secondary | ICD-10-CM

## 2023-09-20 MED ORDER — DOXYCYCLINE HYCLATE 100 MG PO TABS: ORAL_TABLET | ORAL | 1 refills | Status: AC

## 2023-09-20 MED ORDER — ALBUTEROL SULFATE HFA 108 (90 BASE) MCG/ACT IN AERS: 2.0000 | INHALATION_SPRAY | Freq: Four times a day (QID) | RESPIRATORY_TRACT | 2 refills | Status: AC | PRN

## 2023-09-20 MED ORDER — BIKTARVY 50-200-25 MG PO TABS: 1.0000 | ORAL_TABLET | Freq: Every day | ORAL | 3 refills | Status: DC

## 2023-09-20 NOTE — Progress Notes (Unsigned)
HPI: Sean West is a 33 y.o. male who presents to the [facility] pharmacy clinic for HIV follow-up.  Patient Active Problem List   Diagnosis Date Noted   HIV disease (HCC) 11/12/2014   Acute hepatitis B 11/12/2014   History of gonorrhea 11/12/2014   Abnormal liver function tests 10/22/2014    Patient's Medications  New Prescriptions   No medications on file  Previous Medications   ALBUTEROL (VENTOLIN HFA) 108 (90 BASE) MCG/ACT INHALER    Inhale 2 puffs into the lungs every 6 (six) hours as needed for wheezing or shortness of breath.   BICTEGRAVIR-EMTRICITABINE-TENOFOVIR AF (BIKTARVY) 50-200-25 MG TABS TABLET    Take 1 tablet by mouth daily. NEED APPOINTMENT FOR FURTHER REFILLS   DOXYCYCLINE (VIBRA-TABS) 100 MG TABLET    Take 2 tablets (200 mg total) by mouth once as needed for up to 1 dose. Take within 72 hours for post-exposure prevention.  Modified Medications   No medications on file  Discontinued Medications   No medications on file    Labs: Lab Results  Component Value Date   HIV1RNAQUANT 7,210 (H) 04/16/2022   HIV1RNAQUANT <20 (H) 09/18/2021   HIV1RNAQUANT 7,230 (H) 10/16/2020   CD4TABS 335 (L) 04/16/2022   CD4TABS 266 (L) 10/16/2020   CD4TABS 275 (L) 03/07/2020    RPR and STI Lab Results  Component Value Date   LABRPR REACTIVE (A) 09/04/2022   LABRPR REACTIVE (A) 04/16/2022   LABRPR REACTIVE (A) 09/18/2021   LABRPR REACTIVE (A) 10/16/2020   LABRPR REACTIVE (A) 03/21/2020   RPRTITER 1:2 (H) 09/04/2022   RPRTITER 1:2 (H) 04/16/2022   RPRTITER 1:4 (H) 09/18/2021   RPRTITER 1:64 (H) 10/16/2020   RPRTITER 1:1 (H) 03/21/2020    STI Results GC CT  04/16/2022  4:27 PM Negative  Negative   04/16/2022  4:26 PM Negative  Negative   04/16/2022  4:25 PM Negative  Negative   09/18/2021  2:59 PM Negative  Negative   09/18/2021  2:53 PM Negative    Negative  Negative    Negative   10/16/2020  2:38 PM Negative  Negative   10/16/2020  2:37 PM Negative   Negative   10/16/2020  2:26 PM Negative  Negative   03/21/2020  4:28 PM Negative    Negative  Negative    Negative   03/21/2020  2:51 PM Negative  Negative   08/21/2019  9:18 AM Negative    Negative  Negative    Negative   08/16/2019  4:13 PM Negative  Negative   08/02/2019 10:56 AM Negative  Negative   08/15/2018 12:00 AM **POSITIVE**    **POSITIVE**  Negative    Negative   06/07/2018 12:00 AM Negative    Negative    Negative  Negative    Negative    Negative   11/17/2016 12:00 AM **POSITIVE**    Negative  Negative    Negative   05/07/2016 12:00 AM *Negative    *Negative  *Negative    *Negative   07/08/2015 12:00 AM * Negative    *** POSITIVE**    **POSITIVE**  * Negative    *** POSITIVE**    Negative   10/31/2014 12:00 AM NG: Negative  CT: Negative     Hepatitis B Lab Results  Component Value Date   HEPBSAG POSITIVE (A) 10/19/2014   Hepatitis C No results found for: "HEPCAB", "HCVRNAPCRQN" Hepatitis A Lab Results  Component Value Date   HAV NON REACTIVE 10/19/2014   Lipids: Lab Results  Component  Value Date   CHOL 212 (H) 09/18/2021   TRIG 131 09/18/2021   HDL 38 (L) 09/18/2021   CHOLHDL 5.6 (H) 09/18/2021   VLDL 19 11/17/2016   LDLCALC 148 (H) 09/18/2021    Current HIV Regimen: ***  Assessment: Bernell comes in today to follow up for his HIV infection. He usually sees Dr. Drue Second and last saw her in August 2023. He was seen by Arvilla Meres, PA, on 09/04/22 and 09/17/22 for mpox infection and follow up. Last HIV RNA was 7,210 and CD4 count was 335 in August 2023. No labs since then. He is prescribed Biktarvy and fill history shows that he was filling consistently until 11/18/22 but has not filled since then. Also has chronic Hepatitis B. Last DNA checked in July 2021 and was <10. He has a history of syphilis, gonorrhea, chlamydia, and mpox. Also has doxycycline on hand for doxyPEP.   New job - travel - Merchandiser, retail. Assitaed living  nursing home; 5 different ones;   Wenmt to [place] and stayed w mom for 3 months - ordered on mister- biktarvy. 2 more weeks left; off x 1 month; flu last month;   [facility]; pre work out; protein; creatine; collagen;   Elderberry and vitamins; detox pill;   Plan: ***  Jalasia Eskridge L. Amadeo Coke, PharmD, BCIDP, AAHIVP, CPP Clinical Pharmacist Practitioner Infectious Diseases Clinical Pharmacist [facility] 09/20/2023, 11:01 AM

## 2023-09-21 LAB — T-HELPER CELLS (CD4) COUNT (NOT AT ARMC)
CD4 % Helper T Cell: 26 % — ABNORMAL LOW (ref 33–65)
CD4 T Cell Abs: 399 /uL — ABNORMAL LOW (ref 400–1790)

## 2023-09-22 LAB — CYTOLOGY, (ORAL, ANAL, URETHRAL) ANCILLARY ONLY
Chlamydia: NEGATIVE
Chlamydia: NEGATIVE
Comment: NEGATIVE
Comment: NEGATIVE
Comment: NORMAL
Comment: NORMAL
Neisseria Gonorrhea: NEGATIVE
Neisseria Gonorrhea: NEGATIVE

## 2023-09-22 LAB — URINE CYTOLOGY ANCILLARY ONLY
Chlamydia: NEGATIVE
Comment: NEGATIVE
Comment: NORMAL
Neisseria Gonorrhea: NEGATIVE

## 2023-09-23 LAB — CBC WITH DIFFERENTIAL/PLATELET
Absolute Lymphocytes: 1544 {cells}/uL (ref 850–3900)
Absolute Monocytes: 427 {cells}/uL (ref 200–950)
Basophils Absolute: 40 {cells}/uL (ref 0–200)
Basophils Relative: 0.9 %
Eosinophils Absolute: 22 {cells}/uL (ref 15–500)
Eosinophils Relative: 0.5 %
HCT: 44.3 % (ref 38.5–50.0)
Hemoglobin: 14.7 g/dL (ref 13.2–17.1)
MCH: 28.2 pg (ref 27.0–33.0)
MCHC: 33.2 g/dL (ref 32.0–36.0)
MCV: 85 fL (ref 80.0–100.0)
MPV: 11.1 fL (ref 7.5–12.5)
Monocytes Relative: 9.7 %
Neutro Abs: 2367 {cells}/uL (ref 1500–7800)
Neutrophils Relative %: 53.8 %
Platelets: 246 10*3/uL (ref 140–400)
RBC: 5.21 10*6/uL (ref 4.20–5.80)
RDW: 12.4 % (ref 11.0–15.0)
Total Lymphocyte: 35.1 %
WBC: 4.4 10*3/uL (ref 3.8–10.8)

## 2023-09-23 LAB — COMPREHENSIVE METABOLIC PANEL
AG Ratio: 1.2 (calc) (ref 1.0–2.5)
ALT: 18 U/L (ref 9–46)
AST: 21 U/L (ref 10–40)
Albumin: 4.5 g/dL (ref 3.6–5.1)
Alkaline phosphatase (APISO): 56 U/L (ref 36–130)
BUN/Creatinine Ratio: 8 (calc) (ref 6–22)
BUN: 16 mg/dL (ref 7–25)
CO2: 23 mmol/L (ref 20–32)
Calcium: 10 mg/dL (ref 8.6–10.3)
Chloride: 104 mmol/L (ref 98–110)
Creat: 2.03 mg/dL — ABNORMAL HIGH (ref 0.60–1.26)
Globulin: 3.7 g/dL (ref 1.9–3.7)
Glucose, Bld: 93 mg/dL (ref 65–99)
Potassium: 4.1 mmol/L (ref 3.5–5.3)
Sodium: 139 mmol/L (ref 135–146)
Total Bilirubin: 1.2 mg/dL (ref 0.2–1.2)
Total Protein: 8.2 g/dL — ABNORMAL HIGH (ref 6.1–8.1)

## 2023-09-23 LAB — RPR: RPR Ser Ql: REACTIVE — AB

## 2023-09-23 LAB — LIPID PANEL
Cholesterol: 212 mg/dL — ABNORMAL HIGH (ref <200)
HDL: 45 mg/dL (ref >=40)
LDL Cholesterol (Calc): 131 mg/dL — ABNORMAL HIGH
Non-HDL Cholesterol (Calc): 167 mg/dL — ABNORMAL HIGH (ref <130)
Total CHOL/HDL Ratio: 4.7 (calc) (ref <5.0)
Triglycerides: 214 mg/dL — ABNORMAL HIGH (ref <150)

## 2023-09-23 LAB — HEPATITIS B DNA, ULTRAQUANTITATIVE, PCR
Hepatitis B DNA: NOT DETECTED [IU]/mL
Hepatitis B virus DNA: NOT DETECTED {Log}

## 2023-09-23 LAB — HEPATITIS A ANTIBODY, TOTAL: Hepatitis A AB,Total: REACTIVE — AB

## 2023-09-23 LAB — HIV-1 RNA QUANT-NO REFLEX-BLD
HIV 1 RNA Quant: <20 {copies}/mL — ABNORMAL HIGH
HIV-1 RNA Quant, Log: <1.3 {Log} — ABNORMAL HIGH

## 2023-09-23 LAB — RPR TITER: RPR Titer: 1:1 {titer} — ABNORMAL HIGH

## 2023-09-23 LAB — T PALLIDUM AB: T Pallidum Abs: POSITIVE — AB

## 2023-09-23 LAB — HEPATITIS B SURFACE ANTIGEN: Hepatitis B Surface Ag: NONREACTIVE

## 2023-09-23 LAB — TSH: TSH: 1.39 m[IU]/L (ref 0.40–4.50)

## 2023-11-08 ENCOUNTER — Ambulatory Visit: Payer: 59 | Admitting: Internal Medicine

## 2023-11-10 ENCOUNTER — Ambulatory Visit: Payer: 59 | Admitting: Internal Medicine

## 2024-01-04 ENCOUNTER — Other Ambulatory Visit (HOSPITAL_COMMUNITY): Payer: Self-pay

## 2024-02-03 DIAGNOSIS — Z888 Allergy status to other drugs, medicaments and biological substances status: Secondary | ICD-10-CM | POA: Diagnosis not present

## 2024-02-03 DIAGNOSIS — M545 Low back pain, unspecified: Secondary | ICD-10-CM | POA: Diagnosis not present

## 2024-03-01 ENCOUNTER — Other Ambulatory Visit: Payer: Self-pay

## 2024-05-16 ENCOUNTER — Other Ambulatory Visit: Payer: Self-pay | Admitting: Internal Medicine

## 2024-05-16 DIAGNOSIS — B181 Chronic viral hepatitis B without delta-agent: Secondary | ICD-10-CM

## 2024-06-14 ENCOUNTER — Telehealth: Payer: Self-pay

## 2024-06-14 NOTE — Telephone Encounter (Signed)
 Patient called wanting to see if he could have his medications delivered since he has been traveling so much. Offered to switch his medications to [facility]. He would prefer to leave things as is for now. He is overdue for follow up. Scheduled with Dr. Luiz tomorrow morning.   Rutledge Selsor, BSN, RN

## 2024-06-15 ENCOUNTER — Ambulatory Visit: Admitting: Internal Medicine

## 2024-06-15 ENCOUNTER — Other Ambulatory Visit: Payer: Self-pay

## 2024-06-15 ENCOUNTER — Other Ambulatory Visit

## 2024-06-15 ENCOUNTER — Other Ambulatory Visit (HOSPITAL_COMMUNITY)
Admission: RE | Admit: 2024-06-15 | Discharge: 2024-06-15 | Disposition: A | Discharge status: Home / Self Care | Source: Ambulatory Visit | Attending: Internal Medicine | Admitting: Internal Medicine

## 2024-06-15 DIAGNOSIS — Z113 Encounter for screening for infections with a predominantly sexual mode of transmission: Secondary | ICD-10-CM | POA: Diagnosis not present

## 2024-06-15 DIAGNOSIS — B20 Human immunodeficiency virus [HIV] disease: Secondary | ICD-10-CM

## 2024-06-15 DIAGNOSIS — B181 Chronic viral hepatitis B without delta-agent: Secondary | ICD-10-CM

## 2024-06-15 MED ORDER — BIKTARVY 50-200-25 MG PO TABS: 1.0000 | ORAL_TABLET | Freq: Every day | ORAL | 0 refills | Status: DC

## 2024-06-16 LAB — URINE CYTOLOGY ANCILLARY ONLY
Chlamydia: NEGATIVE
Comment: NEGATIVE
Comment: NORMAL
Neisseria Gonorrhea: NEGATIVE

## 2024-06-16 LAB — T-HELPER CELL (CD4) - (RCID CLINIC ONLY)
CD4 % Helper T Cell: 24 % — ABNORMAL LOW (ref 33–65)
CD4 T Cell Abs: 399 /uL — ABNORMAL LOW (ref 400–1790)

## 2024-06-17 LAB — HIV-1 RNA QUANT-NO REFLEX-BLD
HIV 1 RNA Quant: NOT DETECTED {copies}/mL
HIV-1 RNA Quant, Log: NOT DETECTED {Log_copies}/mL

## 2024-06-17 LAB — COMPLETE METABOLIC PANEL WITHOUT GFR
AG Ratio: 1.3 (calc) (ref 1.0–2.5)
ALT: 35 U/L (ref 9–46)
AST: 28 U/L (ref 10–40)
Albumin: 4.7 g/dL (ref 3.6–5.1)
Alkaline phosphatase (APISO): 50 U/L (ref 36–130)
BUN/Creatinine Ratio: 17 (calc) (ref 6–22)
BUN: 24 mg/dL (ref 7–25)
CO2: 25 mmol/L (ref 20–32)
Calcium: 9.9 mg/dL (ref 8.6–10.3)
Chloride: 104 mmol/L (ref 98–110)
Creat: 1.41 mg/dL — ABNORMAL HIGH (ref 0.60–1.26)
Globulin: 3.7 g/dL (ref 1.9–3.7)
Glucose, Bld: 95 mg/dL (ref 65–99)
Potassium: 4.2 mmol/L (ref 3.5–5.3)
Sodium: 137 mmol/L (ref 135–146)
Total Bilirubin: 0.6 mg/dL (ref 0.2–1.2)
Total Protein: 8.4 g/dL — ABNORMAL HIGH (ref 6.1–8.1)

## 2024-06-17 LAB — CBC WITH DIFFERENTIAL/PLATELET
Absolute Lymphocytes: 1710 {cells}/uL (ref 850–3900)
Absolute Monocytes: 363 {cells}/uL (ref 200–950)
Basophils Absolute: 39 {cells}/uL (ref 0–200)
Basophils Relative: 1.3 %
Eosinophils Absolute: 39 {cells}/uL (ref 15–500)
Eosinophils Relative: 1.3 %
HCT: 48.5 % (ref 38.5–50.0)
Hemoglobin: 16.4 g/dL (ref 13.2–17.1)
MCH: 29.1 pg (ref 27.0–33.0)
MCHC: 33.8 g/dL (ref 32.0–36.0)
MCV: 86.1 fL (ref 80.0–100.0)
MPV: 11.2 fL (ref 7.5–12.5)
Monocytes Relative: 12.1 %
Neutro Abs: 849 {cells}/uL — ABNORMAL LOW (ref 1500–7800)
Neutrophils Relative %: 28.3 %
Platelets: 215 Thousand/uL (ref 140–400)
RBC: 5.63 Million/uL (ref 4.20–5.80)
RDW: 13 % (ref 11.0–15.0)
Total Lymphocyte: 57 %
WBC: 3 Thousand/uL — ABNORMAL LOW (ref 3.8–10.8)

## 2024-06-17 LAB — RPR: RPR Ser Ql: REACTIVE — AB

## 2024-06-17 LAB — RPR TITER: RPR Titer: 1:2 {titer} — ABNORMAL HIGH

## 2024-06-17 LAB — T PALLIDUM AB: T Pallidum Abs: POSITIVE — AB

## 2024-07-03 ENCOUNTER — Ambulatory Visit: Admitting: Internal Medicine

## 2024-07-25 ENCOUNTER — Other Ambulatory Visit: Payer: Self-pay | Admitting: Internal Medicine

## 2024-07-25 DIAGNOSIS — B181 Chronic viral hepatitis B without delta-agent: Secondary | ICD-10-CM

## 2024-07-25 DIAGNOSIS — B20 Human immunodeficiency virus [HIV] disease: Secondary | ICD-10-CM

## 2024-09-18 ENCOUNTER — Other Ambulatory Visit: Payer: Self-pay | Admitting: Internal Medicine

## 2024-09-29 ENCOUNTER — Other Ambulatory Visit: Payer: Self-pay | Admitting: Internal Medicine
